# Patient Record
Sex: Female | Born: 1960
Health system: Southern US, Community
[De-identification: ages and names within clinical notes are randomized; demographics above are authoritative.]

## PROBLEM LIST (undated history)

## (undated) DIAGNOSIS — D649 Anemia, unspecified: Secondary | ICD-10-CM

## (undated) DIAGNOSIS — E7800 Pure hypercholesterolemia, unspecified: Secondary | ICD-10-CM

## (undated) DIAGNOSIS — N809 Endometriosis, unspecified: Secondary | ICD-10-CM

## (undated) DIAGNOSIS — K5792 Diverticulitis of intestine, part unspecified, without perforation or abscess without bleeding: Secondary | ICD-10-CM

## (undated) DIAGNOSIS — Z8041 Family history of malignant neoplasm of ovary: Secondary | ICD-10-CM

## (undated) DIAGNOSIS — R87619 Unspecified abnormal cytological findings in specimens from cervix uteri: Secondary | ICD-10-CM

## (undated) DIAGNOSIS — L659 Nonscarring hair loss, unspecified: Secondary | ICD-10-CM

## (undated) DIAGNOSIS — R7303 Prediabetes: Secondary | ICD-10-CM

## (undated) DIAGNOSIS — Z8051 Family history of malignant neoplasm of kidney: Secondary | ICD-10-CM

## (undated) DIAGNOSIS — Z8049 Family history of malignant neoplasm of other genital organs: Secondary | ICD-10-CM

## (undated) DIAGNOSIS — F419 Anxiety disorder, unspecified: Secondary | ICD-10-CM

## (undated) DIAGNOSIS — N200 Calculus of kidney: Secondary | ICD-10-CM

## (undated) DIAGNOSIS — Z8042 Family history of malignant neoplasm of prostate: Secondary | ICD-10-CM

## (undated) DIAGNOSIS — G43909 Migraine, unspecified, not intractable, without status migrainosus: Secondary | ICD-10-CM

## (undated) DIAGNOSIS — C55 Malignant neoplasm of uterus, part unspecified: Secondary | ICD-10-CM

## (undated) DIAGNOSIS — T7840XA Allergy, unspecified, initial encounter: Secondary | ICD-10-CM

## (undated) HISTORY — DX: Anemia, unspecified: D64.9

## (undated) HISTORY — PX: TONSILLECTOMY AND ADENOIDECTOMY: SHX28

## (undated) HISTORY — DX: Family history of malignant neoplasm of ovary: Z80.41

## (undated) HISTORY — DX: Family history of malignant neoplasm of kidney: Z80.51

## (undated) HISTORY — DX: Family history of malignant neoplasm of other genital organs: Z80.49

## (undated) HISTORY — DX: Nonscarring hair loss, unspecified: L65.9

## (undated) HISTORY — DX: Unspecified abnormal cytological findings in specimens from cervix uteri: R87.619

## (undated) HISTORY — PX: OTHER SURGICAL HISTORY: SHX169

## (undated) HISTORY — DX: Calculus of kidney: N20.0

## (undated) HISTORY — DX: Malignant neoplasm of uterus, part unspecified: C55

## (undated) HISTORY — PX: EYE SURGERY: SHX253

## (undated) HISTORY — PX: ABDOMINAL HYSTERECTOMY: SHX81

## (undated) HISTORY — DX: Family history of malignant neoplasm of prostate: Z80.42

## (undated) HISTORY — DX: Prediabetes: R73.03

## (undated) HISTORY — DX: Allergy, unspecified, initial encounter: T78.40XA

---

## 2013-12-26 ENCOUNTER — Emergency Department (HOSPITAL_COMMUNITY)
Admission: EM | Admit: 2013-12-26 | Discharge: 2013-12-26 | Disposition: A | Discharge status: Home / Self Care | Payer: BC Managed Care – PPO | Attending: Emergency Medicine | Admitting: Emergency Medicine

## 2013-12-26 ENCOUNTER — Encounter (HOSPITAL_COMMUNITY): Payer: Self-pay | Admitting: Emergency Medicine

## 2013-12-26 ENCOUNTER — Emergency Department (HOSPITAL_COMMUNITY): Payer: BC Managed Care – PPO

## 2013-12-26 DIAGNOSIS — R5381 Other malaise: Secondary | ICD-10-CM | POA: Insufficient documentation

## 2013-12-26 DIAGNOSIS — K5730 Diverticulosis of large intestine without perforation or abscess without bleeding: Secondary | ICD-10-CM | POA: Insufficient documentation

## 2013-12-26 DIAGNOSIS — Z9089 Acquired absence of other organs: Secondary | ICD-10-CM | POA: Insufficient documentation

## 2013-12-26 DIAGNOSIS — K5790 Diverticulosis of intestine, part unspecified, without perforation or abscess without bleeding: Secondary | ICD-10-CM

## 2013-12-26 DIAGNOSIS — R110 Nausea: Secondary | ICD-10-CM | POA: Insufficient documentation

## 2013-12-26 DIAGNOSIS — R231 Pallor: Secondary | ICD-10-CM | POA: Insufficient documentation

## 2013-12-26 DIAGNOSIS — E7800 Pure hypercholesterolemia, unspecified: Secondary | ICD-10-CM | POA: Insufficient documentation

## 2013-12-26 DIAGNOSIS — Z8742 Personal history of other diseases of the female genital tract: Secondary | ICD-10-CM | POA: Insufficient documentation

## 2013-12-26 DIAGNOSIS — Z8679 Personal history of other diseases of the circulatory system: Secondary | ICD-10-CM | POA: Insufficient documentation

## 2013-12-26 DIAGNOSIS — R55 Syncope and collapse: Secondary | ICD-10-CM

## 2013-12-26 DIAGNOSIS — Z791 Long term (current) use of non-steroidal anti-inflammatories (NSAID): Secondary | ICD-10-CM | POA: Insufficient documentation

## 2013-12-26 DIAGNOSIS — Z79899 Other long term (current) drug therapy: Secondary | ICD-10-CM | POA: Insufficient documentation

## 2013-12-26 DIAGNOSIS — R5383 Other fatigue: Secondary | ICD-10-CM

## 2013-12-26 DIAGNOSIS — F411 Generalized anxiety disorder: Secondary | ICD-10-CM | POA: Insufficient documentation

## 2013-12-26 HISTORY — DX: Endometriosis, unspecified: N80.9

## 2013-12-26 HISTORY — DX: Pure hypercholesterolemia, unspecified: E78.00

## 2013-12-26 HISTORY — DX: Diverticulitis of intestine, part unspecified, without perforation or abscess without bleeding: K57.92

## 2013-12-26 HISTORY — DX: Anxiety disorder, unspecified: F41.9

## 2013-12-26 HISTORY — DX: Migraine, unspecified, not intractable, without status migrainosus: G43.909

## 2013-12-26 LAB — COMPREHENSIVE METABOLIC PANEL
ALT: 25 U/L (ref 0–35)
AST: 25 U/L (ref 0–37)
Albumin: 4 g/dL (ref 3.5–5.2)
Alkaline Phosphatase: 92 U/L (ref 39–117)
BUN: 15 mg/dL (ref 6–23)
CO2: 25 mEq/L (ref 19–32)
Calcium: 9.5 mg/dL (ref 8.4–10.5)
Chloride: 104 mEq/L (ref 96–112)
Creatinine, Ser: 0.69 mg/dL (ref 0.50–1.10)
GFR calc Af Amer: >90 mL/min (ref >90)
GFR calc non Af Amer: >90 mL/min (ref >90)
Glucose, Bld: 91 mg/dL (ref 70–99)
Potassium: 3.9 mEq/L (ref 3.7–5.3)
Sodium: 142 mEq/L (ref 137–147)
Total Bilirubin: 0.7 mg/dL (ref 0.3–1.2)
Total Protein: 7.5 g/dL (ref 6.0–8.3)

## 2013-12-26 LAB — URINALYSIS, ROUTINE W REFLEX MICROSCOPIC
Bilirubin Urine: NEGATIVE
Glucose, UA: NEGATIVE mg/dL
Hgb urine dipstick: NEGATIVE
Ketones, ur: NEGATIVE mg/dL
Leukocytes, UA: NEGATIVE
Nitrite: NEGATIVE
Protein, ur: NEGATIVE mg/dL
Specific Gravity, Urine: 1.008 (ref 1.005–1.030)
Urobilinogen, UA: 0.2 mg/dL (ref 0.0–1.0)
pH: 7 (ref 5.0–8.0)

## 2013-12-26 LAB — CBC WITH DIFFERENTIAL/PLATELET
Basophils Absolute: 0 10*3/uL (ref 0.0–0.1)
Basophils Relative: 0 % (ref 0–1)
Eosinophils Absolute: 1.4 10*3/uL — ABNORMAL HIGH (ref 0.0–0.7)
Eosinophils Relative: 16 % — ABNORMAL HIGH (ref 0–5)
HCT: 39.7 % (ref 36.0–46.0)
Hemoglobin: 13.2 g/dL (ref 12.0–15.0)
Lymphocytes Relative: 28 % (ref 12–46)
Lymphs Abs: 2.5 10*3/uL (ref 0.7–4.0)
MCH: 30 pg (ref 26.0–34.0)
MCHC: 33.2 g/dL (ref 30.0–36.0)
MCV: 90.2 fL (ref 78.0–100.0)
Monocytes Absolute: 0.5 10*3/uL (ref 0.1–1.0)
Monocytes Relative: 6 % (ref 3–12)
Neutro Abs: 4.3 10*3/uL (ref 1.7–7.7)
Neutrophils Relative %: 50 % (ref 43–77)
Platelets: 202 10*3/uL (ref 150–400)
RBC: 4.4 MIL/uL (ref 3.87–5.11)
RDW: 13 % (ref 11.5–15.5)
WBC: 8.6 10*3/uL (ref 4.0–10.5)

## 2013-12-26 LAB — TYPE AND SCREEN
ABO/RH(D): A POS
Antibody Screen: NEGATIVE

## 2013-12-26 LAB — POC OCCULT BLOOD, ED: Fecal Occult Bld: POSITIVE — AB

## 2013-12-26 LAB — ABO/RH: ABO/RH(D): A POS

## 2013-12-26 LAB — CBG MONITORING, ED: Glucose-Capillary: 76 mg/dL (ref 70–99)

## 2013-12-26 LAB — I-STAT CG4 LACTIC ACID, ED: Lactic Acid, Venous: 0.87 mmol/L (ref 0.5–2.2)

## 2013-12-26 LAB — TROPONIN I: Troponin I: <0.3 ng/mL (ref <0.30)

## 2013-12-26 NOTE — ED Notes (Signed)
Pt reports rectal bleeding, weakness, and family reports lethargy. Pt reports she has been having rectal bleeding x1.5 years, went to pcp 1 month ago, has colonoscopy scheduled. For last 2 weeks rectal bleeding has increased, bright red blood, black and tarry at times, also has diarrhea. Abdominal pain 5/10. Family came to see pt on Sunday and noticed increased weakness and lethargy, which has progressed. Daughter called pcp and was encouraged to bring pt to ED.  Nausea present. Denies dysuria.

## 2013-12-26 NOTE — ED Provider Notes (Signed)
CSN: [id number]     Arrival date & time 12/26/13  1404 History   First MD Initiated Contact with Patient 12/26/13 1459     Chief Complaint  Patient presents with  . Rectal Bleeding     (Consider location/radiation/quality/duration/timing/severity/associated sxs/prior Treatment) HPI  This a 53 year old female with history of cholesterol, diverticulitis, endometriosis who presents with rectal bleeding and weakness. Per the patient she has had multiple episodes of red blood per rectum over the last year and a half. She was referred to a GI specialist and scheduled for a colonoscopy. She reports over last 2 weeks rectal bleeding has increased including part of blood as well as dark per stools. Patient also reports diarrhea. She reports diffuse abdominal pain. Currently rates a 5/10. She denies any vomiting. Family has noticed increased weakness and lethargy. Patient endorses nausea.  Was called immediately to the patient's bedside as she appears to have had a syncopal episode. Patient came to she is awake, alert, and oriented. She was then able to provide history. She did appear pale. She had palpable radial pulses.  Past Medical History  Diagnosis Date  . High cholesterol   . Migraines   . Diverticulitis   . Endometriosis   . Anxiety    Past Surgical History  Procedure Laterality Date  . Abdominal hysterectomy     History reviewed. No pertinent family history. History  Substance Use Topics  . Smoking status: Never Smoker   . Smokeless tobacco: Not on file  . Alcohol Use: Yes     Comment: rarely   OB History   Grav Para Term Preterm Abortions TAB SAB Ect Mult Living                 Review of Systems  Constitutional: Positive for fatigue. Negative for fever.  Respiratory: Negative for cough, chest tightness and shortness of breath.   Cardiovascular: Negative for chest pain.  Gastrointestinal: Positive for nausea, abdominal pain, diarrhea and blood in stool. Negative for  vomiting.  Genitourinary: Negative for dysuria.  Musculoskeletal: Negative for back pain.  Skin: Positive for pallor.  Neurological: Positive for syncope. Negative for headaches.  Psychiatric/Behavioral: Negative for confusion.  All other systems reviewed and are negative.     Allergies  Ivp dye  Home Medications   Prior to Admission medications   Medication Sig Start Date End Date Taking? Authorizing Provider  ALPRAZolam Duanne Moron) 1 MG tablet Take 0.5 mg by mouth at bedtime as needed for anxiety.   Yes Historical Provider, MD  atorvastatin (LIPITOR) 40 MG tablet Take 40 mg by mouth at bedtime.   Yes Historical Provider, MD  citalopram (CELEXA) 40 MG tablet Take 40 mg by mouth at bedtime.   Yes Historical Provider, MD  Cyanocobalamin (VITAMIN B-12 PO) Take 1 tablet by mouth daily.   Yes Historical Provider, MD  lactose free nutrition (BOOST) LIQD Take 237 mLs by mouth 3 (three) times daily between meals.   Yes Historical Provider, MD  MELATONIN PO Take 1 tablet by mouth at bedtime.   Yes Historical Provider, MD  naproxen sodium (ANAPROX) 220 MG tablet Take 220 mg by mouth 2 (two) times daily with a meal.   Yes Historical Provider, MD  OVER THE COUNTER MEDICATION Take 2 oz by mouth daily. Vemma Liq   Yes Historical Provider, MD  PEDIALYTE (PEDIALYTE) SOLN Take 240 mLs by mouth every 8 (eight) hours.   Yes Historical Provider, MD  Polyethyl Glycol-Propyl Glycol (SYSTANE) 0.4-0.3 % SOLN Place 1 drop  into both eyes 2 (two) times daily.   Yes Historical Provider, MD   BP 130/78  Pulse 77  Temp(Src) 98.1 F (36.7 C) (Oral)  Resp 21  SpO2 99% Physical Exam  Nursing note and vitals reviewed. Constitutional: She is oriented to person, place, and time. No distress.  Pale  HENT:  Head: Normocephalic and atraumatic.  Mouth/Throat: Oropharynx is clear and moist.  Eyes: Conjunctivae are normal. Pupils are equal, round, and reactive to light.  Conjunctiva without evidence of pallor  Neck:  Neck supple.  Cardiovascular: Normal rate, regular rhythm and normal heart sounds.   No murmur heard. Pulmonary/Chest: Effort normal and breath sounds normal. No respiratory distress. She has no wheezes.  Abdominal: Soft. Bowel sounds are normal. She exhibits no distension. There is tenderness. There is no rebound and no guarding.  Genitourinary: Guaiac negative stool.  Musculoskeletal: She exhibits no edema.  Neurological: She is alert and oriented to person, place, and time.  Skin: Skin is warm and dry.  Psychiatric: She has a normal mood and affect.    ED Course  Procedures (including critical care time) Labs Review Labs Reviewed  CBC WITH DIFFERENTIAL - Abnormal; Notable for the following:    Eosinophils Relative 16 (*)    Eosinophils Absolute 1.4 (*)    All other components within normal limits  POC OCCULT BLOOD, ED - Abnormal; Notable for the following:    Fecal Occult Bld POSITIVE (*)    All other components within normal limits  COMPREHENSIVE METABOLIC PANEL  URINALYSIS, ROUTINE W REFLEX MICROSCOPIC  TROPONIN I  CBG MONITORING, ED  I-STAT CG4 LACTIC ACID, ED  TYPE AND SCREEN  ABO/RH    Imaging Review Ct Abdomen Pelvis Wo Contrast  12/26/2013   CLINICAL DATA:  Rectal bleeding  EXAM: CT ABDOMEN AND PELVIS WITHOUT CONTRAST  TECHNIQUE: Multidetector CT imaging of the abdomen and pelvis was performed following the standard protocol without IV contrast.  COMPARISON:  None.  FINDINGS: BODY WALL: Unremarkable.  LOWER CHEST: Unremarkable.  ABDOMEN/PELVIS:  Liver: No focal abnormality.  Biliary: No evidence of biliary obstruction or stone.  Pancreas: Unremarkable.  Spleen: Unremarkable.  Adrenals: Unremarkable.  Kidneys and ureters: No hydronephrosis or stone.  Bladder: Unremarkable.  Reproductive: Hysterectomy.  Normal-appearing ovaries.  Bowel: No bowel obstruction. No bowel wall thickened. There is mild distal colonic diverticulosis. Negative appendix.  Retroperitoneum: Mild  haziness of the jejunal mesenteric fat, usually incidental in the absence of adenopathy, mass, or bowel wall thickening.  Peritoneum: No pneumoperitoneum or ascites.  Vascular: No acute abnormality.  OSSEOUS: No acute abnormalities.  IMPRESSION: Distal colonic diverticulosis. No other identifiable cause of rectal bleeding.   Electronically Signed   By: Jorje Guild M.D.   On: 12/26/2013 21:26     EKG Interpretation   Date/Time:  Tuesday December 26 2013 14:50:35 EDT Ventricular Rate:  65 PR Interval:  140 QRS Duration: 92 QT Interval:  437 QTC Calculation: 454 R Axis:   21 Text Interpretation:  Sinus rhythm Confirmed by HORTON  MD, Loma Sousa  ([id number]) on 12/26/2013 3:37:03 PM      MDM   Final diagnoses:  Diverticulosis  Syncope   Patient presents with rectal bleeding.  She reports over your symptoms with acute worsening over the last several days. Was called to the bedside for what appeared to be a syncopal episode. The patient was awake, alert, and oriented. She is nontoxic. Vital signs are within normal limits. Orthostatics negative. 2 large-bore IVs were placed and patient was  typed and screened.  Workup is largely unremarkable including a hemoglobin of 13.2 which is within normal range. Patient was fecal occult positive but no gross blood noted. Unsure of whether she had a true syncopal event prior to my evaluation.  I discussed with patient her workup and that I feel she can follow up with GI as planned. There is no need for more emergent endoscopy tonight. Patient is very concerned about her symptoms and "wants answers." Her abdomen continues to be benign. I discussed the patient that this could be related to her diverticular disease that she is known to have. Patient is requesting CT scanning. I discussed with patient that I felt this was likely low field but given persistence of symptoms and possible syncopal episode prior to my evaluation, will obtain CT. CT shows diverticulosis without  diverticulitis. Provided results patients. She was hemodynamically stable throughout her ER stay. She will followup with her GI doctor as planned. Patient was given strict return precautions.  After history, exam, and medical workup I feel the patient has been appropriately medically screened and is safe for discharge home. Pertinent diagnoses were discussed with the patient. Patient was given return precautions.     Merryl Hacker, MD 12/28/13 [phone]

## 2013-12-26 NOTE — ED Notes (Signed)
Pt ambulatory to bathroom with steady gait.

## 2013-12-26 NOTE — ED Notes (Signed)
Pt brady to 81s. Thready radial and femoral pulses present. Horton MD at bedside. POC CBG 76. MD aware. Fluids started.

## 2013-12-26 NOTE — Discharge Instructions (Signed)
Diverticulosis  Diverticulosis is a common condition that develops when small pouches (diverticula) form in the wall of the colon. The risk of diverticulosis increases with age. It happens more often in people who eat a low-fiber diet. Most individuals with diverticulosis have no symptoms. Those individuals with symptoms usually experience abdominal pain, constipation, or loose stools (diarrhea).  HOME CARE INSTRUCTIONS   · Increase the amount of fiber in your diet as directed by your caregiver or dietician. This may reduce symptoms of diverticulosis.  · Your caregiver may recommend taking a dietary fiber supplement.  · Drink at least 6 to 8 glasses of water each day to prevent constipation.  · Try not to strain when you have a bowel movement.  · Your caregiver may recommend avoiding nuts and seeds to prevent complications, although this is still an uncertain benefit.  · Only take over-the-counter or prescription medicines for pain, discomfort, or fever as directed by your caregiver.  FOODS WITH HIGH FIBER CONTENT INCLUDE:  · Fruits. Apple, peach, pear, tangerine, raisins, prunes.  · Vegetables. Brussels sprouts, asparagus, broccoli, cabbage, carrot, cauliflower, romaine lettuce, spinach, summer squash, tomato, winter squash, zucchini.  · Starchy Vegetables. Baked beans, kidney beans, lima beans, split peas, lentils, potatoes (with skin).  · Grains. Whole wheat bread, brown rice, bran flake cereal, plain oatmeal, white rice, shredded wheat, bran muffins.  SEEK IMMEDIATE MEDICAL CARE IF:   · You develop increasing pain or severe bloating.  · You have an oral temperature above 102° F (38.9° C), not controlled by medicine.  · You develop vomiting or bowel movements that are bloody or black.  Document Released: 04/09/2004 Document Revised: 10/05/2011 Document Reviewed: 12/11/2009  Exit[employer]® Patient Information ©2014 [employer].

## 2014-01-18 ENCOUNTER — Telehealth: Payer: Self-pay | Admitting: Obstetrics and Gynecology

## 2014-01-18 NOTE — Telephone Encounter (Signed)
Confirming pts appt °

## 2014-01-18 NOTE — Telephone Encounter (Signed)
Confirmed.

## 2014-01-25 ENCOUNTER — Ambulatory Visit (INDEPENDENT_AMBULATORY_CARE_PROVIDER_SITE_OTHER): Payer: BC Managed Care – PPO | Admitting: Obstetrics and Gynecology

## 2014-01-25 ENCOUNTER — Encounter: Payer: Self-pay | Admitting: Obstetrics and Gynecology

## 2014-01-25 VITALS — BP 110/76 | HR 84 | Resp 14 | Ht 61.75 in | Wt 176.4 lb

## 2014-01-25 DIAGNOSIS — Z0000 Encounter for general adult medical examination without abnormal findings: Secondary | ICD-10-CM

## 2014-01-25 DIAGNOSIS — Z01419 Encounter for gynecological examination (general) (routine) without abnormal findings: Secondary | ICD-10-CM

## 2014-01-25 NOTE — Patient Instructions (Signed)

## 2014-01-25 NOTE — Progress Notes (Signed)
Patient ID: Maria Gross, female   DOB: 08/30/1960, 53 y.o.   MRN: [id number] GYNECOLOGY VISIT  PCP:  Gavin Pound, MD  Referring provider:   HPI: 53 y.o.   Married  Caucasian  female   G1P1001 with No LMP recorded. Patient has had a hysterectomy.   here for   AEX. Status post laparoscopic hysterectomy.for cervical dysplasia   Has blood in the stool and diarrhea.  Just had colonoscopy - dx with diverticulitis and internal hemorrhoids.   History of nephrolithiasis and calcium deposits in ureters?  Age 61, 52, and early 77s.  Hgb:  PCP Urine:  Neg  GYNECOLOGIC HISTORY: No LMP recorded. Patient has had a hysterectomy. Sexually active:  yes Partner preference: female Contraception:   Hysterectomy Menopausal hormone therapy: no DES exposure:  no  Blood transfusions:   no Sexually transmitted diseases:   no GYN procedures and prior surgeries:  L-TAH-ovaries remain Last mammogram: 08/2009 X[facility],  [place]                Last pap and high risk HPV testing:  08/2009 wnl--[facility],  [place]  History of abnormal pap smear: yes, when 36 yrs. Old pt. Had pre-cancerous cells on pap which lead to Hysterectomy.    OB History   Grav Para Term Preterm Abortions TAB SAB Ect Mult Living   1 1 1       1        LIFESTYLE: Exercise:    Stationary bike and walking           Tobacco:   no Alcohol:     Occ. wine Drug use:  no  OTHER HEALTH MAINTENANCE: Tetanus/TDap:   9 years ago Gardisil:              n/a Influenza:           04/2013 Zostavax:           n/a  Bone density:    2010 normal: [facility], [place] (free through work) Colonoscopy:   01-09-14 normal with Dr. Anson Fret except for diverticulitis and internal hemorrhoids.  Next colonoscopy due 12/2023.  Cholesterol check:  02/2013 normal  Family History  Problem Relation Age of Onset  . Diabetes Mother 63  . Other Mother     passed away from a blood clot after sinus surgery  . Migraines Mother   . COPD Father 75  . Diabetes Father    . Cancer Maternal Grandfather 58    dec metastatic prostate ca  . Diabetes Sister     There are no active problems to display for this patient.  Past Medical History  Diagnosis Date  . High cholesterol   . Migraines   . Diverticulitis   . Endometriosis   . Anxiety   . Abnormal Pap smear of cervix     --age 44 had precancerous changes on pap and had hysterectomy  . Anemia     Past Surgical History  Procedure Laterality Date  . Tonsillectomy and adenoidectomy      -age 5  . Abdominal hysterectomy      L-TAH--ovaries remain  . Left kidney blockage Left     age 11 and age 55  . Urethral stent Left     age 53    ALLERGIES: Ivp dye  Current Outpatient Prescriptions  Medication Sig Dispense Refill  . ALPRAZolam (XANAX) 1 MG tablet Take 0.5 mg by mouth at bedtime as needed for anxiety.      Marland Kitchen  atorvastatin (LIPITOR) 40 MG tablet Take 40 mg by mouth at bedtime.      . citalopram (CELEXA) 40 MG tablet Take 40 mg by mouth at bedtime.      . Cyanocobalamin (VITAMIN B-12 PO) Take 1 tablet by mouth daily.      Marland Kitchen MELATONIN PO Take 1 tablet by mouth at bedtime.      . naproxen sodium (ANAPROX) 220 MG tablet Take 220 mg by mouth 2 (two) times daily with a meal.      . OVER THE COUNTER MEDICATION Take 2 oz by mouth daily. Vemma Liq      . Polyethyl Glycol-Propyl Glycol (SYSTANE) 0.4-0.3 % SOLN Place 1 drop into both eyes 2 (two) times daily.       No current facility-administered medications for this visit.     ROS:  Pertinent items are noted in HPI.  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  PHYSICAL EXAMINATION:    BP 110/76  Pulse 84  Resp 14  Ht 5' 1.75" (1.568 m)  Wt 176 lb 6.4 oz (80.015 kg)  BMI 32.54 kg/m2   Wt Readings from Last 3 Encounters:  01/25/14 176 lb 6.4 oz (80.015 kg)     Ht Readings from Last 3 Encounters:  01/25/14 5' 1.75" (1.568 m)    General appearance: alert, cooperative and appears stated age Head: Normocephalic,  without obvious abnormality, atraumatic Neck: no adenopathy, supple, symmetrical, trachea midline and thyroid not enlarged, symmetric, no tenderness/mass/nodules Lungs: clear to auscultation bilaterally Breasts: Inspection negative, No nipple retraction or dimpling, No nipple discharge or bleeding, No axillary or supraclavicular adenopathy, Normal to palpation without dominant masses Heart: regular rate and rhythm Abdomen: soft, non-tender; no masses,  no organomegaly Extremities: extremities normal, atraumatic, no cyanosis or edema Skin: Skin color, texture, turgor normal. No rashes or lesions Lymph nodes: Cervical, supraclavicular, and axillary nodes normal. No abnormal inguinal nodes palpated Neurologic: Grossly normal  Pelvic: External genitalia:  no lesions              Urethra:  normal appearing urethra with no masses, tenderness or lesions              Bartholins and Skenes: normal                 Vagina: normal appearing vagina with normal color and discharge, no lesions              Cervix:  absent              Pap and high risk HPV testing done: Yes.  .            Bimanual Exam:  Uterus:   absent                                      Adnexa: normal adnexa in size, nontender and no masses                                      Rectovaginal: Confirms                                      Anus:  normal sphincter tone, no lesions  ASSESSMENT  Normal gynecologic exam. Status  post laparoscopic hysterectomy for cervical dysplasia.   PLAN  Mammogram recommended yearly.  Pap smear and high risk HPV testing done.  Counseled on self breast exam, Calcium and vitamin D intake, exercise. Return annually or prn   An After Visit Summary was printed and given to the patient.

## 2014-01-30 LAB — IPS PAP TEST WITH HPV

## 2014-02-20 ENCOUNTER — Ambulatory Visit
Admission: RE | Admit: 2014-02-20 | Discharge: 2014-02-20 | Disposition: A | Discharge status: Home / Self Care | Payer: BC Managed Care – PPO | Source: Ambulatory Visit | Attending: Obstetrics and Gynecology | Admitting: Obstetrics and Gynecology

## 2014-02-20 ENCOUNTER — Other Ambulatory Visit: Payer: Self-pay | Admitting: Obstetrics and Gynecology

## 2014-02-20 ENCOUNTER — Encounter (INDEPENDENT_AMBULATORY_CARE_PROVIDER_SITE_OTHER): Payer: Self-pay

## 2014-02-20 DIAGNOSIS — Z1231 Encounter for screening mammogram for malignant neoplasm of breast: Secondary | ICD-10-CM

## 2014-02-20 DIAGNOSIS — Z01419 Encounter for gynecological examination (general) (routine) without abnormal findings: Secondary | ICD-10-CM

## 2014-04-09 ENCOUNTER — Encounter: Payer: Self-pay | Admitting: Obstetrics and Gynecology

## 2014-05-28 ENCOUNTER — Encounter: Payer: Self-pay | Admitting: Obstetrics and Gynecology

## 2014-09-20 ENCOUNTER — Telehealth: Payer: Self-pay | Admitting: Obstetrics and Gynecology

## 2014-09-20 NOTE — Telephone Encounter (Signed)
Patient states she would like to see Dr. Quincy Simmonds to discuss menopausal symptoms. Patient declines earlier appointments offered, scheduled for 10/05/14 at 0945. She will call back with any concerns or worsening symptoms.   Routing to provider for final review. Patient agreeable to disposition. Will close encounter

## 2014-09-20 NOTE — Telephone Encounter (Signed)
Pt states she is having hot flashes, irritability and anxiety. Believes she is starting menopause. Requests appointment with Dr Quincy Simmonds.  Work number is best before 5pm. It is a direct line.

## 2014-10-05 ENCOUNTER — Encounter: Payer: Self-pay | Admitting: Obstetrics and Gynecology

## 2014-10-05 ENCOUNTER — Ambulatory Visit (INDEPENDENT_AMBULATORY_CARE_PROVIDER_SITE_OTHER): Payer: BLUE CROSS/BLUE SHIELD | Admitting: Obstetrics and Gynecology

## 2014-10-05 VITALS — BP 110/70 | HR 90 | Ht 61.75 in | Wt 182.4 lb

## 2014-10-05 DIAGNOSIS — N951 Menopausal and female climacteric states: Secondary | ICD-10-CM | POA: Diagnosis not present

## 2014-10-05 LAB — TSH: TSH: 0.765 u[IU]/mL (ref 0.350–4.500)

## 2014-10-05 MED ORDER — ESTRADIOL 0.05 MG/24HR TD PTTW: 1.0000 | MEDICATED_PATCH | TRANSDERMAL | Status: DC

## 2014-10-05 NOTE — Patient Instructions (Signed)
Estradiol skin patches What is this medicine? ESTRADIOL (es tra DYE ole) skin patches contain an estrogen. It is mostly used as hormone replacement in menopausal women. It helps to treat hot flashes and prevent osteoporosis. It is also used to treat women with low estrogen levels or those who have had their ovaries removed. This medicine may be used for other purposes; ask your health care provider or pharmacist if you have questions. COMMON BRAND NAME(S): Alora, Climara, Esclim, Estraderm, FemPatch, Menostar, Minivelle, Vivelle, Vivelle-Dot What should I tell my health care provider before I take this medicine? They need to know if you have any of these conditions: -abnormal vaginal bleeding -blood vessel disease or blood clots -breast, cervical, endometrial, ovarian, liver, or uterine cancer -dementia -diabetes -gallbladder disease -heart disease or recent heart attack -high blood pressure -high cholesterol -high level of calcium in the blood -hysterectomy -kidney disease -liver disease -migraine headaches -protein C deficiency -protein S deficiency -stroke -systemic lupus erythematosus (SLE) -tobacco smoker -an unusual or allergic reaction to estrogens, other hormones, medicines, foods, dyes, or preservatives -pregnant or trying to get pregnant -breast-feeding How should I use this medicine? This medicine is for external use only. Follow the directions on the prescription label. Tear open the pouch, do not use scissors. Remove the stiff protective liner covering the adhesive. Try not to touch the adhesive. Apply the patch, sticky side to the skin, to an area that is clean, dry and hairless. Avoid injured, irritated, calloused, or scarred areas. Do not apply the skin patches to your breasts or around the waistline. Use a different site each time to prevent skin irritation. Do not cut or trim the patch. Do not stop using except on the advice of your doctor or health care professional.  Do not wear more than one patch at a time unless you are told to do so by your doctor or health care professional. Contact your pediatrician regarding the use of this medicine in children. Special care may be needed. A patient package insert for the product will be given with each prescription and refill. Read this sheet carefully each time. The sheet may change frequently. Overdosage: If you think you have taken too much of this medicine contact a poison control center or emergency room at once. NOTE: This medicine is only for you. Do not share this medicine with others. What if I miss a dose? If you miss a dose, apply it as soon as you can. If it is almost time for your next dose, apply only that dose. Do not apply double or extra doses. What may interact with this medicine? Do not take this medicine with any of the following medications: -aromatase inhibitors like aminoglutethimide, anastrozole, exemestane, letrozole, testolactone This medicine may also interact with the following medications: -carbamazepine -certain antibiotics used to treat infections -certain barbiturates used for inducing sleep or treating seizures -grapefruit juice -medicines for fungus infections like itraconazole and ketoconazole -raloxifene or tamoxifen -rifabutin, rifampin, or rifapentine -ritonavir -St. John's Wort This list may not describe all possible interactions. Give your health care provider a list of all the medicines, herbs, non-prescription drugs, or dietary supplements you use. Also tell them if you smoke, drink alcohol, or use illegal drugs. Some items may interact with your medicine. What should I watch for while using this medicine? Visit your doctor or health care professional for regular checks on your progress. You will need a regular breast and pelvic exam and Pap smear while on this medicine. You should also   discuss the need for regular mammograms with your health care professional, and follow  his or her guidelines for these tests. This medicine can make your body retain fluid, making your fingers, hands, or ankles swell. Your blood pressure can go up. Contact your doctor or health care professional if you feel you are retaining fluid. If you have any reason to think you are pregnant, stop taking this medicine right away and contact your doctor or health care professional. Smoking increases the risk of getting a blood clot or having a stroke while you are taking this medicine, especially if you are more than 53 years old. You are strongly advised not to smoke. If you wear contact lenses and notice visual changes, or if the lenses begin to feel uncomfortable, consult your eye doctor or health care professional. This medicine can increase the risk of developing a condition (endometrial hyperplasia) that may lead to cancer of the lining of the uterus. Taking progestins, another hormone drug, with this medicine lowers the risk of developing this condition. Therefore, if your uterus has not been removed (by a hysterectomy), your doctor may prescribe a progestin for you to take together with your estrogen. You should know, however, that taking estrogens with progestins may have additional health risks. You should discuss the use of estrogens and progestins with your health care professional to determine the benefits and risks for you. If you are going to have surgery or an MRI, you may need to stop taking this medicine. Consult your health care professional for advice before you schedule the surgery. You may bathe or participate in other activities while wearing your patch. If the patch pulls loose or falls off, you may reapply it if the patch is sticky enough to stay on the skin. You should reapply the patch in a different area. Use a fresh patch if it will no longer stick. What side effects may I notice from receiving this medicine? Side effects that you should report to your doctor or health care  professional as soon as possible: -allergic reactions like skin rash, itching or hives, swelling of the face, lips, or tongue -breast tissue changes or discharge -changes in vision -chest pain -confusion, trouble speaking or understanding -dark urine -general ill feeling or flu-like symptoms -light-colored stools -nausea, vomiting -pain, swelling, warmth in the leg -right upper belly pain -severe headaches -shortness of breath -sudden numbness or weakness of the face, arm or leg -trouble walking, dizziness, loss of balance or coordination -unusual vaginal bleeding -yellowing of the eyes or skin Side effects that usually do not require medical attention (report to your doctor or health care professional if they continue or are bothersome): -hair loss -increased hunger or thirst -increased urination -symptoms of vaginal infection like itching, irritation or unusual discharge -unusually weak or tired This list may not describe all possible side effects. Call your doctor for medical advice about side effects. You may report side effects to FDA at 1-800-FDA-1088. Where should I keep my medicine? Keep out of the reach of children. Store at room temperature below 30 degrees C (86 degrees F). Do not store any patches that have been removed from their protective pouch. Throw away any unused medicine after the expiration date. Dispose of used patches properly. Since used patches may still contain active hormones, fold the patch in half so that it sticks to itself prior to disposal. NOTE: This sheet is a summary. It may not cover all possible information. If you have questions about this medicine,  talk to your doctor, pharmacist, or health care provider.  2015, Elsevier/[employer] Standard. (2010-10-15 09:19:41)

## 2014-10-05 NOTE — Progress Notes (Signed)
Patient ID: Maria Gross, female   DOB: 1961-06-19, 54 y.o.   MRN: [id number] GYNECOLOGY VISIT  PCP:  Gavin Pound, MD  Referring provider:   HPI: 54 y.o.   Married  Caucasian  female   G1P1001 with No LMP recorded. Patient has had a hysterectomy.   here for consultation regarding menopausal symptoms.  Patient complains of hot flashes, "can't sleep", headaches and irritability.   Night sweats.   Tried relaxation therapy.  No over the counter remedies.   No contraindications to estrogen.   GYNECOLOGIC HISTORY: No LMP recorded. Patient has had a hysterectomy. Sexually active:  yes Partner preference: female Contraception:  hysterectomy  Menopausal hormone therapy: none DES exposure:  no  Blood transfusions: no   Sexually transmitted diseases:  no  GYN procedures and prior surgeries:  L-TAH-ovaries remain Last mammogram:  02-20-14 fibroglandular density/nl:[facility]               Last pap and high risk HPV testing:  01-25-14 wnl:neg HR HPV History of abnormal pap smear:  Yes, when 36 yrs.old pt. Had pre-cancerous cells on pap which lead to hysterectomy   OB History    Gravida Para Term Preterm AB TAB SAB Ectopic Multiple Living   1 1 1       1        LIFESTYLE: Exercise: stationary bike and walking              Tobacco:   no Alcohol:  occ wine Drug use:  no  There are no active problems to display for this patient.   Past Medical History  Diagnosis Date  . High cholesterol   . Migraines   . Diverticulitis   . Endometriosis   . Anxiety   . Abnormal Pap smear of cervix     --age 73 had precancerous changes on pap and had hysterectomy  . Anemia     Past Surgical History  Procedure Laterality Date  . Tonsillectomy and adenoidectomy      -age 9  . Abdominal hysterectomy      L-TAH--ovaries remain  . Left kidney blockage Left     age 32 and age 16  . Urethral stent Left     age 14    Current Outpatient Prescriptions  Medication Sig Dispense Refill  .  ALPRAZolam (XANAX) 1 MG tablet Take 0.5 mg by mouth at bedtime as needed for anxiety.    . citalopram (CELEXA) 40 MG tablet Take 40 mg by mouth at bedtime.    . Cyanocobalamin (VITAMIN B-12 PO) Take 1 tablet by mouth daily.    Marland Kitchen loratadine (CLARITIN) 10 MG tablet Take 10 mg by mouth daily.    Marland Kitchen MELATONIN PO Take 1 tablet by mouth at bedtime.    . Multiple Vitamin (MULTIVITAMIN) capsule Take 1 capsule by mouth daily.    . polycarbophil (FIBERCON) 625 MG tablet Take 625 mg by mouth daily.    Vladimir Faster Glycol-Propyl Glycol (SYSTANE) 0.4-0.3 % SOLN Place 1 drop into both eyes 2 (two) times daily.    Marland Kitchen atorvastatin (LIPITOR) 40 MG tablet Take 40 mg by mouth at bedtime.     No current facility-administered medications for this visit.     ALLERGIES: Ivp dye  Family History  Problem Relation Age of Onset  . Diabetes Mother 55  . Other Mother     passed away from a blood clot after sinus surgery  . Migraines Mother   . COPD Father 64  .  Diabetes Father   . Cancer Maternal Grandfather 5    dec metastatic prostate ca  . Diabetes Sister     History   Social History  . Marital Status: Married    Spouse Name: N/A  . Number of Children: N/A  . Years of Education: N/A   Occupational History  . Not on file.   Social History Main Topics  . Smoking status: Never Smoker   . Smokeless tobacco: Not on file  . Alcohol Use: 0.0 oz/week    0 Standard drinks or equivalent per week     Comment: rarely wine  . Drug Use: No  . Sexual Activity:    Partners: Male     Comment: Hyst   Other Topics Concern  . Not on file   Social History Narrative    ROS:  Pertinent items are noted in HPI.  PHYSICAL EXAMINATION:    BP 110/70 mmHg  Pulse 90  Ht 5' 1.75" (1.568 m)  Wt 182 lb 6.4 oz (82.736 kg)  BMI 33.65 kg/m2   Wt Readings from Last 3 Encounters:  10/05/14 182 lb 6.4 oz (82.736 kg)  01/25/14 176 lb 6.4 oz (80.015 kg)     Ht Readings from Last 3 Encounters:  10/05/14 5' 1.75"  (1.568 m)  01/25/14 5' 1.75" (1.568 m)    General appearance: alert, cooperative and appears stated age  ASSESSMENT  Menopausal symptoms.  Anxiety treated with Celexa.  PLAN  Discussion of menopausal symptoms and changes and treatment options - herbal remedies, estrogen therapy, and nonestrogenic prescription - SSRIs, Effexor.   Will proceed with Minivelle 0.05 mg twice weekly.  Discussed risks of DVT, PE, MI, stroke, breast cancer.  Instructed in use.  Handout on menopause to patient. Follow up in about 2 months.   An After Visit Summary was printed and given to the patient.  15 minutes face to face time of which over 50% was spent in counseling.

## 2014-10-06 LAB — FOLLICLE STIMULATING HORMONE: FSH: 105.6 m[IU]/mL

## 2014-10-06 LAB — ESTRADIOL: Estradiol: 27.4 pg/mL

## 2014-10-17 ENCOUNTER — Telehealth: Payer: Self-pay | Admitting: Obstetrics and Gynecology

## 2014-10-17 NOTE — Telephone Encounter (Signed)
Spoke with patient. Patient states that since she has started using the [place] patches her breasts have been sore, swollen, and she has nipple tenderness.  Symptoms started two days after starting [place]. States hot flashes, headaches, and irritability have greatly decreased. "It is working great for my other symptoms but not I have this new symptom. I paid 150 dollars for a three month supply so I prefer not to change to something else but maybe to change my patch less." Advised patient will need to speak with provider and return call with further recommendations. Patient is agreeable.

## 2014-10-17 NOTE — Telephone Encounter (Signed)
Pt having problems with the hormone patches.

## 2014-10-17 NOTE — Telephone Encounter (Signed)
Try cutting the patch in half to lower the dosage.  Use 1/2 patch twice a week on clean, dry skin.  Contact the office back to report how she is doing.

## 2014-10-17 NOTE — Telephone Encounter (Signed)
Left message to call Kaulana Brindle at [phone]. 

## 2014-10-18 NOTE — Telephone Encounter (Signed)
Spoke with patient. Advised of message as seen below from [place]. Patient is agreeable and verbalizes understanding. Will call to provide update on how she is doing with dosage change.  Routing to provider for final review. Patient agreeable to disposition. Will close encounter

## 2014-12-04 ENCOUNTER — Telehealth: Payer: Self-pay | Admitting: Obstetrics and Gynecology

## 2014-12-04 NOTE — Telephone Encounter (Signed)
Provider cx. Left message to cb and rs. Pt appt for 2 month recheck

## 2014-12-05 ENCOUNTER — Ambulatory Visit: Payer: BLUE CROSS/BLUE SHIELD | Admitting: Obstetrics and Gynecology

## 2014-12-05 NOTE — Telephone Encounter (Signed)
Pt rs for 12/14/14. Closing encounter

## 2014-12-14 ENCOUNTER — Ambulatory Visit (INDEPENDENT_AMBULATORY_CARE_PROVIDER_SITE_OTHER): Payer: BLUE CROSS/BLUE SHIELD | Admitting: Obstetrics and Gynecology

## 2014-12-14 ENCOUNTER — Encounter: Payer: Self-pay | Admitting: Obstetrics and Gynecology

## 2014-12-14 VITALS — BP 110/82 | HR 88 | Ht 61.75 in | Wt 184.2 lb

## 2014-12-14 DIAGNOSIS — N951 Menopausal and female climacteric states: Secondary | ICD-10-CM

## 2014-12-14 NOTE — Progress Notes (Signed)
Patient ID: Maria Gross, female   DOB: August 15, 1960, 54 y.o.   MRN: [id number] GYNECOLOGY  VISIT   HPI: 54 y.o.   Married  Caucasian  female   G1P1001 with No LMP recorded. Patient has had a hysterectomy.   here for follow up. Patient presented 10/05/14 with symptoms of flashes, "can't sleep", night sweats, headaches and irritability.   Started Minivelle 0.05 mg, 1/2 patch twice weekly. No adhesion problems.  When used the whole patch, had breast tenderness.  Sleeping with the assistance of melatonin and alprazolam.   Mood is better.  Has more patience.  Husband noted a difference.   GYNECOLOGIC HISTORY: No LMP recorded. Patient has had a hysterectomy. Contraception:Hysterectomy Menopausal hormone therapy: Minivelle 1/2 patch twice weekly Last mammogram: 02-20-14 density/nl:[facility] Last pap smear: 01-25-14 wnl:neg HR HPV        OB History    Gravida Para Term Preterm AB TAB SAB Ectopic Multiple Living   1 1 1       1          There are no active problems to display for this patient.   Past Medical History  Diagnosis Date  . High cholesterol   . Migraines   . Diverticulitis   . Endometriosis   . Anxiety   . Abnormal Pap smear of cervix     --age 54 had precancerous changes on pap and had hysterectomy  . Anemia     Past Surgical History  Procedure Laterality Date  . Tonsillectomy and adenoidectomy      -age 54  . Abdominal hysterectomy      L-TAH--ovaries remain  . Left kidney blockage Left     age 54 and age 79  . Urethral stent Left     age 29    Current Outpatient Prescriptions  Medication Sig Dispense Refill  . ALPRAZolam (XANAX) 1 MG tablet Take 0.5 mg by mouth at bedtime as needed for anxiety.    Marland Kitchen atorvastatin (LIPITOR) 40 MG tablet Take 40 mg by mouth at bedtime.    . citalopram (CELEXA) 40 MG tablet Take 40 mg by mouth at bedtime.    . Cyanocobalamin (VITAMIN B-12 PO) Take 1 tablet by mouth daily.    Marland Kitchen estradiol (MINIVELLE) 0.05 MG/24HR patch  Place 1 patch (0.05 mg total) onto the skin 2 (two) times a week. Apply anywhere on lower abdomen twice weekly. 24 patch 0  . loratadine (CLARITIN) 10 MG tablet Take 10 mg by mouth daily.    Marland Kitchen MELATONIN PO Take 1 tablet by mouth at bedtime.    . Multiple Vitamin (MULTIVITAMIN) capsule Take 1 capsule by mouth daily.    . polycarbophil (FIBERCON) 625 MG tablet Take 625 mg by mouth daily.    Vladimir Faster Glycol-Propyl Glycol (SYSTANE) 0.4-0.3 % SOLN Place 1 drop into both eyes 2 (two) times daily.     No current facility-administered medications for this visit.     ALLERGIES: Ivp dye  Family History  Problem Relation Age of Onset  . Diabetes Mother 90  . Other Mother     passed away from a blood clot after sinus surgery  . Migraines Mother   . COPD Father 18  . Diabetes Father   . Cancer Maternal Grandfather 22    dec metastatic prostate ca  . Diabetes Sister     History   Social History  . Marital Status: Married    Spouse Name: N/A  . Number of Children: N/A  .  Years of Education: N/A   Occupational History  . Not on file.   Social History Main Topics  . Smoking status: Never Smoker   . Smokeless tobacco: Not on file  . Alcohol Use: 0.0 oz/week    0 Standard drinks or equivalent per week     Comment: rarely wine  . Drug Use: No  . Sexual Activity:    Partners: Male     Comment: Hyst   Other Topics Concern  . Not on file   Social History Narrative    ROS:  Pertinent items are noted in HPI.  PHYSICAL EXAMINATION:    BP 110/82 mmHg  Pulse 88  Ht 5' 1.75" (1.568 m)  Wt 184 lb 3.2 oz (83.553 kg)  BMI 33.98 kg/m2    General appearance: alert, cooperative and appears stated age.  Pleasant affect.   ASSESSMENT  Menopausal symptoms controlled on ERT patch.  Status post TAH.  PLAN  Counseled regarding options for dosing medication.  OK to continue with Minivelle 0.05 mg, 1/2 patch twice weekly for now.  For next Rx, can use Climara 0.025 mg weekly.  Will  return for annual exam and mammogram in July 2016.    An After Visit Summary was printed and given to the patient.  ___15___ minutes face to face time of which over 50% was spent in counseling.

## 2014-12-14 NOTE — Patient Instructions (Signed)
Continue with your estrogen patch 1/2 patch to clean dry skin twice a week.  See you in July for your annual exam.

## 2015-02-01 ENCOUNTER — Encounter: Payer: Self-pay | Admitting: Obstetrics and Gynecology

## 2015-02-01 ENCOUNTER — Ambulatory Visit (INDEPENDENT_AMBULATORY_CARE_PROVIDER_SITE_OTHER): Payer: BLUE CROSS/BLUE SHIELD | Admitting: Obstetrics and Gynecology

## 2015-02-01 ENCOUNTER — Telehealth: Payer: Self-pay | Admitting: *Deleted

## 2015-02-01 ENCOUNTER — Ambulatory Visit: Payer: BC Managed Care – PPO | Admitting: Obstetrics and Gynecology

## 2015-02-01 VITALS — BP 100/64 | HR 80 | Resp 14 | Ht 62.25 in | Wt 182.4 lb

## 2015-02-01 DIAGNOSIS — Z0000 Encounter for general adult medical examination without abnormal findings: Secondary | ICD-10-CM

## 2015-02-01 DIAGNOSIS — Z01419 Encounter for gynecological examination (general) (routine) without abnormal findings: Secondary | ICD-10-CM

## 2015-02-01 DIAGNOSIS — Z23 Encounter for immunization: Secondary | ICD-10-CM

## 2015-02-01 LAB — POCT URINALYSIS DIPSTICK
Bilirubin, UA: NEGATIVE
Blood, UA: NEGATIVE
Glucose, UA: NEGATIVE
Ketones, UA: NEGATIVE
Leukocytes, UA: NEGATIVE
Nitrite, UA: NEGATIVE
Protein, UA: NEGATIVE
Urobilinogen, UA: NEGATIVE
pH, UA: 6

## 2015-02-01 MED ORDER — TETANUS-DIPHTH-ACELL PERTUSSIS 5-2.5-18.5 LF-MCG/0.5 IM SUSP
0.5000 mL | Freq: Once | INTRAMUSCULAR | Status: AC
  Administered 2015-02-01: 0.5 mL via INTRAMUSCULAR

## 2015-02-01 MED ORDER — ESTRADIOL 0.025 MG/24HR TD PTWK: 0.0250 mg | MEDICATED_PATCH | TRANSDERMAL | Status: DC

## 2015-02-01 NOTE — Telephone Encounter (Signed)
-----   Message from Nunzio Cobbs, MD sent at 02/01/2015  9:30 AM EDT ----- Regarding: please call to schedule TDap Please call to schedule TDap vaccine.   She left prior to receiving this.   Thanks,  [place]

## 2015-02-01 NOTE — Progress Notes (Signed)
Patient ID: Maria Gross, female   DOB: June 17, 1961, 54 y.o.   MRN: [id number] 54 y.o. G64P1001 Married Caucasian female here for annual exam.   Uses Vivelle Dot 0.05 mg 1/2 patch twice weekly. Mood swings were to biggest issue for the patient in menopause. Feels great right now.   Notes weight gain and urinary loss. Urge related loss. 3 coffees per day.   PCP:  Sadie Haber at [facility] - labs with PCPZA  No LMP recorded. Patient has had a hysterectomy.          Sexually active: Yes.   female The current method of family planning is status post hysterectomy.   Ovaries remain.  Exercising: No.  none. Smoker:  no  Health Maintenance: Pap:  01-25-14 wnl:neg HR HPV History of abnormal Pap:  Yes, at 54 yrs old had pre-cancerous cells on pap which lead to hysterectomy. MMG:  02-20-14 Density Cat.B/neg:[facility].  Patient will call to schedule. Colonoscopy:  01-09-14 normal with Dr. Penelope Coop except for diverticulitis and internal hemorrhoids.  Next due 12/2023. BMD:   2010  Result  Normal at [facility], [place](free through work) TDaP:  10 years ago Screening Labs:  Hb today: PCP, Urine today: Neg   reports that she has never smoked. She does not have any smokeless tobacco history on file. She reports that she does not drink alcohol or use illicit drugs.  Past Medical History  Diagnosis Date  . High cholesterol   . Migraines   . Diverticulitis   . Endometriosis   . Anxiety   . Abnormal Pap smear of cervix     --age 54 had precancerous changes on pap and had hysterectomy  . Anemia     Past Surgical History  Procedure Laterality Date  . Tonsillectomy and adenoidectomy      -age 54  . Abdominal hysterectomy      L-TAH--ovaries remain  . Left kidney blockage Left     age 54 and age 89  . Urethral stent Left     age 54    Current Outpatient Prescriptions  Medication Sig Dispense Refill  . ALPRAZolam (XANAX) 1 MG tablet Take 0.5 mg by mouth at bedtime as needed for anxiety.    Marland Kitchen  atorvastatin (LIPITOR) 40 MG tablet Take 40 mg by mouth at bedtime.    . citalopram (CELEXA) 40 MG tablet Take 40 mg by mouth at bedtime.    . Cyanocobalamin (VITAMIN B-12 PO) Take 1 tablet by mouth daily.    Marland Kitchen estradiol (MINIVELLE) 0.05 MG/24HR patch Place 1 patch (0.05 mg total) onto the skin 2 (two) times a week. Apply anywhere on lower abdomen twice weekly. 24 patch 0  . loratadine (CLARITIN) 10 MG tablet Take 10 mg by mouth daily.    Marland Kitchen MELATONIN PO Take 1 tablet by mouth at bedtime.    . Multiple Vitamin (MULTIVITAMIN) capsule Take 1 capsule by mouth daily.    . polycarbophil (FIBERCON) 625 MG tablet Take 625 mg by mouth daily.    Vladimir Faster Glycol-Propyl Glycol (SYSTANE) 0.4-0.3 % SOLN Place 1 drop into both eyes 2 (two) times daily.     No current facility-administered medications for this visit.    Family History  Problem Relation Age of Onset  . Diabetes Mother 52  . Other Mother     passed away from a blood clot after sinus surgery  . Migraines Mother   . COPD Father 23  . Diabetes Father   . Cancer Maternal  Grandfather 46    dec metastatic prostate ca  . Diabetes Sister     ROS:  Pertinent items are noted in HPI.  Otherwise, a comprehensive ROS was negative.  Exam:   BP 100/64 mmHg  Pulse 80  Resp 14  Ht 5' 2.25" (1.581 m)  Wt 182 lb 6.4 oz (82.736 kg)  BMI 33.10 kg/m2    General appearance: alert, cooperative and appears stated age Head: Normocephalic, without obvious abnormality, atraumatic Neck: no adenopathy, supple, symmetrical, trachea midline and thyroid normal to inspection and palpation Lungs: clear to auscultation bilaterally Breasts: normal appearance, no masses or tenderness, Inspection negative, No nipple retraction or dimpling, No nipple discharge or bleeding, No axillary or supraclavicular adenopathy Heart: regular rate and rhythm Abdomen: soft, non-tender; bowel sounds normal; no masses,  no organomegaly Extremities: extremities normal,  atraumatic, no cyanosis or edema Skin: Skin color, texture, turgor normal. No rashes or lesions Lymph nodes: Cervical, supraclavicular, and axillary nodes normal. No abnormal inguinal nodes palpated Neurologic: Grossly normal  Pelvic: External genitalia:  no lesions              Urethra:  normal appearing urethra with no masses, tenderness or lesions              Bartholins and Skenes: normal                 Vagina: normal appearing vagina with normal color and discharge, no lesions              Cervix: absent              Pap taken: Yes.   Bimanual Exam:  Uterus:  uterus absent              Adnexa: normal adnexa and no mass, fullness, tenderness              Rectovaginal: Yes.  .  Confirms.              Anus:  normal sphincter tone, no lesions  Chaperone was present for exam.  Assessment:   Well woman visit with normal exam. ERT patient. Urinary urgency.   Plan: Yearly mammogram recommended after age 54. She will call to schedule. Recommended self breast exam.  Pap and HR HPV as above. Discussed Calcium, Vitamin D, regular exercise program including cardiovascular and weight bearing exercise. Labs performed.  No..   See orders. Refills given on medications.  Yes.  .  See orders.  Cliimara 0.025 mg weekly to skin.  Discussed risks of DVT, PE, MI, stroke, breast CA.  Wishes to continue.  Discussed discontinuation of bladder irritants. TDap recommended. Follow up annually and prn.      After visit summary provided.

## 2015-02-01 NOTE — Telephone Encounter (Signed)
Patient says she will stop by office today and ask for me to have tdap given.

## 2015-02-01 NOTE — Patient Instructions (Signed)

## 2015-02-01 NOTE — Telephone Encounter (Signed)
Left Message To Call Back  

## 2015-02-01 NOTE — Addendum Note (Signed)
Addended by: Gilberto Better on: 02/01/2015 02:59 PM   Modules accepted: Orders, SmartSet

## 2015-02-06 LAB — IPS PAP TEST WITH HPV

## 2015-07-18 ENCOUNTER — Other Ambulatory Visit: Payer: Self-pay

## 2015-07-18 DIAGNOSIS — Z1231 Encounter for screening mammogram for malignant neoplasm of breast: Secondary | ICD-10-CM

## 2015-08-14 ENCOUNTER — Ambulatory Visit
Admission: RE | Admit: 2015-08-14 | Discharge: 2015-08-14 | Disposition: A | Discharge status: Home / Self Care | Payer: BLUE CROSS/BLUE SHIELD | Source: Ambulatory Visit

## 2015-08-14 DIAGNOSIS — Z1231 Encounter for screening mammogram for malignant neoplasm of breast: Secondary | ICD-10-CM

## 2016-02-20 NOTE — Progress Notes (Signed)
55 y.o. [id number] Married Caucasian female here for annual exam.    Patient is on a Climara patch 0.025 mg weekly.  Is cutting the patch in half and using it once a week.  Having a reaction to the adhesive.  Not having hot flashes. Feels it is helping her emotionally.  Wants to continue.   Had hysterectomy for cervical dysplasia.  Was told it was for "the rapid growing kind."  PCP:   [facility]  No LMP recorded. Patient has had a hysterectomy.           Sexually active: Yes.    The current method of family planning is status post hysterectomy--ovaries remain.    Exercising: No.  The patient does not participate in regular exercise at present. Smoker:  no  Health Maintenance: Pap:  02-01-15 Neg:Neg HR HPV History of abnormal Pap:  Yes, at 55 yrs old had pre-cancerous cells on pap which lead to hysterectomy. MMG:  08-14-15 3D/Density B/Neg/Birads1:[facility] Colonoscopy:  01-09-14 normal with Dr. Penelope Coop except for diverticulitis and internal hemorrhoids;next due 12/2023. BMD:   2010  Result  Normal at [facility] thru wk) TDaP:  02-01-15 HIV:  PCP Hep C:  PCP Screening Labs:  Hb today: PCP, Urine today: normal   reports that she has never smoked. She has never used smokeless tobacco. She reports that she does not drink alcohol or use drugs.  Past Medical History:  Diagnosis Date  . Abnormal Pap smear of cervix    --age 67 had precancerous changes on pap and had hysterectomy  . Anemia   . Anxiety   . Diverticulitis   . Endometriosis   . High cholesterol   . Migraines     Past Surgical History:  Procedure Laterality Date  . ABDOMINAL HYSTERECTOMY     L-TAH--ovaries remain  . left kidney blockage Left    age 77 and age 79  . TONSILLECTOMY AND ADENOIDECTOMY     -age 63  . urethral stent Left    age 21    Current Outpatient Prescriptions  Medication Sig Dispense Refill  . ALPRAZolam (XANAX) 1 MG tablet Take 0.5 mg by mouth at bedtime as needed for anxiety.     Marland Kitchen atorvastatin (LIPITOR) 40 MG tablet Take 40 mg by mouth at bedtime.    . citalopram (CELEXA) 40 MG tablet Take 40 mg by mouth at bedtime.    . Cyanocobalamin (VITAMIN B-12 PO) Take 1 tablet by mouth daily.    Marland Kitchen estradiol (CLIMARA - DOSED IN MG/24 HR) 0.025 mg/24hr patch Place 1 patch (0.025 mg total) onto the skin once a week. 12 patch 3  . loratadine (CLARITIN) 10 MG tablet Take 10 mg by mouth daily.    Marland Kitchen MELATONIN PO Take 1 tablet by mouth at bedtime.    . Multiple Vitamin (MULTIVITAMIN) capsule Take 1 capsule by mouth daily.    . polycarbophil (FIBERCON) 625 MG tablet Take 625 mg by mouth daily.    Vladimir Faster Glycol-Propyl Glycol (SYSTANE) 0.4-0.3 % SOLN Place 1 drop into both eyes 2 (two) times daily.     No current facility-administered medications for this visit.     Family History  Problem Relation Age of Onset  . Diabetes Mother 72  . Other Mother     passed away from a blood clot after sinus surgery  . Migraines Mother   . COPD Father 70  . Diabetes Father   . Diabetes Sister   . Cancer Maternal Grandfather  53    dec metastatic prostate ca    ROS:  Pertinent items are noted in HPI.  Otherwise, a comprehensive ROS was negative.  Exam:   BP 110/62 (BP Location: Right Arm, Patient Position: Sitting, Cuff Size: Normal)   Pulse 88   Resp 16   Ht 5\' 2"  (1.575 m)   Wt 176 lb (79.8 kg)   BMI 32.19 kg/m     General appearance: alert, cooperative and appears stated age Head: Normocephalic, without obvious abnormality, atraumatic Neck: no adenopathy, supple, symmetrical, trachea midline and thyroid normal to inspection and palpation Lungs: clear to auscultation bilaterally Breasts: normal appearance, no masses or tenderness, Inspection negative, No nipple retraction or dimpling, No nipple discharge or bleeding, No axillary or supraclavicular adenopathy Heart: regular rate and rhythm Abdomen:  soft, non-tender; no masses, no organomegaly Extremities: extremities normal,  atraumatic, no cyanosis or edema Skin: Skin color, texture, turgor normal. No rashes or lesions Lymph nodes: Cervical, supraclavicular, and axillary nodes normal. No abnormal inguinal nodes palpated Neurologic: Grossly normal  Pelvic: External genitalia:  no lesions              Urethra:  normal appearing urethra with no masses, tenderness or lesions              Bartholins and Skenes: normal                 Vagina: normal appearing vagina with normal color and discharge, no lesions              Cervix: absent              Pap taken: Yes.   Bimanual Exam:  Uterus:  uterus absent              Adnexa: no mass, fullness, tenderness              Rectal exam: Yes.  .  Confirms.              Anus:  normal sphincter tone, no lesions  Chaperone was present for exam.  Assessment:   Well woman visit with normal exam. Status post hysterectomy for cervical dysplasia.  Ovaries remain.  On ERT.  Plan: Yearly mammogram recommended after age 33.  Recommended self breast exam.  Pap and HR HPV as above. Discussed Calcium, Vitamin D, regular exercise program including cardiovascular and weight bearing exercise. Labs performed.  No..   See orders. Prescription medication(s) given.  Yes.  .  See orders.  Minivelle 0.0375 twice weekly. Discussed risks of DVT, PE, stroke and possible MI and breast cancer.  She wishes to continue. Will get op report and pathology report for hysterectomy.   Follow up annually and prn.      After visit summary provided.

## 2016-02-21 ENCOUNTER — Encounter: Payer: Self-pay | Admitting: Obstetrics and Gynecology

## 2016-02-21 ENCOUNTER — Ambulatory Visit (INDEPENDENT_AMBULATORY_CARE_PROVIDER_SITE_OTHER): Payer: BLUE CROSS/BLUE SHIELD | Admitting: Obstetrics and Gynecology

## 2016-02-21 VITALS — BP 110/62 | HR 88 | Resp 16 | Ht 62.0 in | Wt 176.0 lb

## 2016-02-21 DIAGNOSIS — Z79899 Other long term (current) drug therapy: Secondary | ICD-10-CM

## 2016-02-21 DIAGNOSIS — Z0000 Encounter for general adult medical examination without abnormal findings: Secondary | ICD-10-CM | POA: Diagnosis not present

## 2016-02-21 DIAGNOSIS — Z01419 Encounter for gynecological examination (general) (routine) without abnormal findings: Secondary | ICD-10-CM | POA: Diagnosis not present

## 2016-02-21 LAB — POCT URINALYSIS DIPSTICK
Bilirubin, UA: NEGATIVE
Blood, UA: NEGATIVE
Glucose, UA: NEGATIVE
Ketones, UA: NEGATIVE
Leukocytes, UA: NEGATIVE
Nitrite, UA: NEGATIVE
Protein, UA: NEGATIVE
Urobilinogen, UA: NEGATIVE
pH, UA: 5

## 2016-02-21 MED ORDER — ESTRADIOL 0.0375 MG/24HR TD PTTW: 1.0000 | MEDICATED_PATCH | TRANSDERMAL | 3 refills | Status: DC

## 2016-02-21 NOTE — Patient Instructions (Signed)
Health Maintenance, Female Adopting a healthy lifestyle and getting preventive care can go a long way to promote health and wellness. Talk with your health care provider about what schedule of regular examinations is right for you. This is a good chance for you to check in with your provider about disease prevention and staying healthy. In between checkups, there are plenty of things you can do on your own. Experts have done a lot of research about which lifestyle changes and preventive measures are most likely to keep you healthy. Ask your health care provider for more information. WEIGHT AND DIET  Eat a healthy diet  Be sure to include plenty of vegetables, fruits, low-fat dairy products, and lean protein.  Do not eat a lot of foods high in solid fats, added sugars, or salt.  Get regular exercise. This is one of the most important things you can do for your health.  Most adults should exercise for at least 150 minutes each week. The exercise should increase your heart rate and make you sweat (moderate-intensity exercise).  Most adults should also do strengthening exercises at least twice a week. This is in addition to the moderate-intensity exercise.  Maintain a healthy weight  Body mass index (BMI) is a measurement that can be used to identify possible weight problems. It estimates body fat based on height and weight. Your health care provider can help determine your BMI and help you achieve or maintain a healthy weight.  For females 20 years of age and older:   A BMI below 18.5 is considered underweight.  A BMI of 18.5 to 24.9 is normal.  A BMI of 25 to 29.9 is considered overweight.  A BMI of 30 and above is considered obese.  Watch levels of cholesterol and blood lipids  You should start having your blood tested for lipids and cholesterol at 54 years of age, then have this test every 5 years.  You may need to have your cholesterol levels checked more often if:  Your lipid  or cholesterol levels are high.  You are older than 54 years of age.  You are at high risk for heart disease.  CANCER SCREENING   Lung Cancer  Lung cancer screening is recommended for adults 55-80 years old who are at high risk for lung cancer because of a history of smoking.  A yearly low-dose CT scan of the lungs is recommended for people who:  Currently smoke.  Have quit within the past 15 years.  Have at least a 30-pack-year history of smoking. A pack year is smoking an average of one pack of cigarettes a day for 1 year.  Yearly screening should continue until it has been 15 years since you quit.  Yearly screening should stop if you develop a health problem that would prevent you from having lung cancer treatment.  Breast Cancer  Practice breast self-awareness. This means understanding how your breasts normally appear and feel.  It also means doing regular breast self-exams. Let your health care provider know about any changes, no matter how small.  If you are in your 20s or 30s, you should have a clinical breast exam (CBE) by a health care provider every 1-3 years as part of a regular health exam.  If you are 40 or older, have a CBE every year. Also consider having a breast X-ray (mammogram) every year.  If you have a family history of breast cancer, talk to your health care provider about genetic screening.  If you   are at high risk for breast cancer, talk to your health care provider about having an MRI and a mammogram every year.  Breast cancer gene (BRCA) assessment is recommended for women who have family members with BRCA-related cancers. BRCA-related cancers include:  Breast.  Ovarian.  Tubal.  Peritoneal cancers.  Results of the assessment will determine the need for genetic counseling and BRCA1 and BRCA2 testing. Cervical Cancer Your health care provider may recommend that you be screened regularly for cancer of the pelvic organs (ovaries, uterus, and  vagina). This screening involves a pelvic examination, including checking for microscopic changes to the surface of your cervix (Pap test). You may be encouraged to have this screening done every 3 years, beginning at age 21.  For women ages 30-65, health care providers may recommend pelvic exams and Pap testing every 3 years, or they may recommend the Pap and pelvic exam, combined with testing for human papilloma virus (HPV), every 5 years. Some types of HPV increase your risk of cervical cancer. Testing for HPV may also be done on women of any age with unclear Pap test results.  Other health care providers may not recommend any screening for nonpregnant women who are considered low risk for pelvic cancer and who do not have symptoms. Ask your health care provider if a screening pelvic exam is right for you.  If you have had past treatment for cervical cancer or a condition that could lead to cancer, you need Pap tests and screening for cancer for at least 20 years after your treatment. If Pap tests have been discontinued, your risk factors (such as having a new sexual partner) need to be reassessed to determine if screening should resume. Some women have medical problems that increase the chance of getting cervical cancer. In these cases, your health care provider may recommend more frequent screening and Pap tests. Colorectal Cancer  This type of cancer can be detected and often prevented.  Routine colorectal cancer screening usually begins at 54 years of age and continues through 54 years of age.  Your health care provider may recommend screening at an earlier age if you have risk factors for colon cancer.  Your health care provider may also recommend using home test kits to check for hidden blood in the stool.  A small camera at the end of a tube can be used to examine your colon directly (sigmoidoscopy or colonoscopy). This is done to check for the earliest forms of colorectal  cancer.  Routine screening usually begins at age 50.  Direct examination of the colon should be repeated every 5-10 years through 54 years of age. However, you may need to be screened more often if early forms of precancerous polyps or small growths are found. Skin Cancer  Check your skin from head to toe regularly.  Tell your health care provider about any new moles or changes in moles, especially if there is a change in a mole's shape or color.  Also tell your health care provider if you have a mole that is larger than the size of a pencil eraser.  Always use sunscreen. Apply sunscreen liberally and repeatedly throughout the day.  Protect yourself by wearing long sleeves, pants, a wide-brimmed hat, and sunglasses whenever you are outside. HEART DISEASE, DIABETES, AND HIGH BLOOD PRESSURE   High blood pressure causes heart disease and increases the risk of stroke. High blood pressure is more likely to develop in:  People who have blood pressure in the high end   of the normal range (130-139/85-89 mm Hg).  People who are overweight or obese.  People who are African American.  If you are 38-23 years of age, have your blood pressure checked every 3-5 years. If you are 61 years of age or older, have your blood pressure checked every year. You should have your blood pressure measured twice--once when you are at a hospital or clinic, and once when you are not at a hospital or clinic. Record the average of the two measurements. To check your blood pressure when you are not at a hospital or clinic, you can use:  An automated blood pressure machine at a pharmacy.  A home blood pressure monitor.  If you are between 45 years and 39 years old, ask your health care provider if you should take aspirin to prevent strokes.  Have regular diabetes screenings. This involves taking a blood sample to check your fasting blood sugar level.  If you are at a normal weight and have a low risk for diabetes,  have this test once every three years after 54 years of age.  If you are overweight and have a high risk for diabetes, consider being tested at a younger age or more often. PREVENTING INFECTION  Hepatitis B  If you have a higher risk for hepatitis B, you should be screened for this virus. You are considered at high risk for hepatitis B if:  You were born in a country where hepatitis B is common. Ask your health care provider which countries are considered high risk.  Your parents were born in a high-risk country, and you have not been immunized against hepatitis B (hepatitis B vaccine).  You have HIV or AIDS.  You use needles to inject street drugs.  You live with someone who has hepatitis B.  You have had sex with someone who has hepatitis B.  You get hemodialysis treatment.  You take certain medicines for conditions, including cancer, organ transplantation, and autoimmune conditions. Hepatitis C  Blood testing is recommended for:  Everyone born from [birth year] through 1964.  Anyone with known risk factors for hepatitis C. Sexually transmitted infections (STIs)  You should be screened for sexually transmitted infections (STIs) including gonorrhea and chlamydia if:  You are sexually active and are younger than 54 years of age.  You are older than 53 years of age and your health care provider tells you that you are at risk for this type of infection.  Your sexual activity has changed since you were last screened and you are at an increased risk for chlamydia or gonorrhea. Ask your health care provider if you are at risk.  If you do not have HIV, but are at risk, it may be recommended that you take a prescription medicine daily to prevent HIV infection. This is called pre-exposure prophylaxis (PrEP). You are considered at risk if:  You are sexually active and do not regularly use condoms or know the HIV status of your partner(s).  You take drugs by injection.  You are sexually  active with a partner who has HIV. Talk with your health care provider about whether you are at high risk of being infected with HIV. If you choose to begin PrEP, you should first be tested for HIV. You should then be tested every 3 months for as long as you are taking PrEP.  PREGNANCY   If you are premenopausal and you may become pregnant, ask your health care provider about preconception counseling.  If you may  become pregnant, take 400 to 800 micrograms (mcg) of folic acid every day.  If you want to prevent pregnancy, talk to your health care provider about birth control (contraception). OSTEOPOROSIS AND MENOPAUSE   Osteoporosis is a disease in which the bones lose minerals and strength with aging. This can result in serious bone fractures. Your risk for osteoporosis can be identified using a bone density scan.  If you are 61 years of age or older, or if you are at risk for osteoporosis and fractures, ask your health care provider if you should be screened.  Ask your health care provider whether you should take a calcium or vitamin D supplement to lower your risk for osteoporosis.  Menopause may have certain physical symptoms and risks.  Hormone replacement therapy may reduce some of these symptoms and risks. Talk to your health care provider about whether hormone replacement therapy is right for you.  HOME CARE INSTRUCTIONS   Schedule regular health, dental, and eye exams.  Stay current with your immunizations.   Do not use any tobacco products including cigarettes, chewing tobacco, or electronic cigarettes.  If you are pregnant, do not drink alcohol.  If you are breastfeeding, limit how much and how often you drink alcohol.  Limit alcohol intake to no more than 1 drink per day for nonpregnant women. One drink equals 12 ounces of beer, 5 ounces of wine, or 1 ounces of hard liquor.  Do not use street drugs.  Do not share needles.  Ask your health care provider for help if  you need support or information about quitting drugs.  Tell your health care provider if you often feel depressed.  Tell your health care provider if you have ever been abused or do not feel safe at home.   This information is not intended to replace advice given to you by your health care provider. Make sure you discuss any questions you have with your health care provider.   Document Released: 01/26/2011 Document Revised: 08/03/2014 Document Reviewed: 06/14/2013 Elsevier Interactive Patient Education [employer].

## 2016-02-24 LAB — IPS PAP TEST WITH HPV

## 2016-04-07 ENCOUNTER — Telehealth: Payer: Self-pay | Admitting: Obstetrics and Gynecology

## 2016-04-07 NOTE — Telephone Encounter (Signed)
Returned call to patient. Patient states that she was seen here in July for her AEX and Dr. Quincy Simmonds changed her patch to a smaller one that she changes twice weekly, due to skin irritation with previous patch. Patient states, "I don't think this one is as effective, or as strong as the other patch. I'm beginning to have symptoms again like anxiety, and I can't deal with things as I normally do." She also states she has hot flashes that mostly occur at night. Patient states she recently had lab work done at [place] on [address] if our office needs those. Patient declines office visit, and states she doesn't really see the need to be seen, that she thinks Dr. Quincy Simmonds can just switch what she is on. RN advised this message would be sent to Dr. Quincy Simmonds and our office would return call with any additional recommendations. Patient agreeable and aware Dr. Quincy Simmonds is out of the office today.    Routing to provider for review.

## 2016-04-07 NOTE — Telephone Encounter (Signed)
The new estrogen patch is actually stronger 0.0375 mg instead of 0.025 mg.  May I request the patient come in for an office with with me?

## 2016-04-07 NOTE — Telephone Encounter (Signed)
Patient wants to speak with the nurse. No information given. °

## 2016-04-08 NOTE — Telephone Encounter (Signed)
Thank you for the update.  I have closed the encounter.  

## 2016-04-08 NOTE — Telephone Encounter (Signed)
Spoke with patient and gave message from Dr. Quincy Simmonds. Patient  states that "after talking with Raquel Sarna I got to thinking that I've only been on this new dose for a couple weeks. I finished out my old dose till it was gone. I think I need to give it some more time." She also said that she and her husband are in the middle of buying a new home and she has had increased stress from that. She declined an appointment at this time and wants to give her new dose a little more time. She will call the office if she needs anything  sending to Dr. Quincy Simmonds for review. -eh

## 2016-10-08 ENCOUNTER — Other Ambulatory Visit: Payer: Self-pay | Admitting: Obstetrics and Gynecology

## 2016-10-08 DIAGNOSIS — Z1231 Encounter for screening mammogram for malignant neoplasm of breast: Secondary | ICD-10-CM

## 2016-10-27 ENCOUNTER — Ambulatory Visit
Admission: RE | Admit: 2016-10-27 | Discharge: 2016-10-27 | Disposition: A | Discharge status: Home / Self Care | Payer: BLUE CROSS/BLUE SHIELD | Source: Ambulatory Visit | Attending: Obstetrics and Gynecology | Admitting: Obstetrics and Gynecology

## 2016-10-27 DIAGNOSIS — Z1231 Encounter for screening mammogram for malignant neoplasm of breast: Secondary | ICD-10-CM | POA: Diagnosis not present

## 2016-10-28 ENCOUNTER — Other Ambulatory Visit: Payer: Self-pay | Admitting: Obstetrics and Gynecology

## 2016-10-28 NOTE — Telephone Encounter (Signed)
Medication refill request: estradiol  Last AEX:  02-21-16  Next AEX: 03-19-17  Last MMG (if hormonal medication request): 10-27-16 WNL  Refill authorized: please advise

## 2017-02-20 ENCOUNTER — Other Ambulatory Visit: Payer: Self-pay | Admitting: Obstetrics and Gynecology

## 2017-02-22 NOTE — Telephone Encounter (Signed)
Medication refill request: Vilvelle Patch  Last AEX:  02-21-16  Next AEX: 03-19-17  Last MMG (if hormonal medication request): 10-27-16 WNL  Refill authorized: please advise

## 2017-03-16 DIAGNOSIS — R7301 Impaired fasting glucose: Secondary | ICD-10-CM | POA: Diagnosis not present

## 2017-03-16 DIAGNOSIS — Z0000 Encounter for general adult medical examination without abnormal findings: Secondary | ICD-10-CM | POA: Diagnosis not present

## 2017-03-16 DIAGNOSIS — E7800 Pure hypercholesterolemia, unspecified: Secondary | ICD-10-CM | POA: Diagnosis not present

## 2017-03-18 NOTE — Progress Notes (Signed)
56 y.o. G38P1001 Married Caucasian female here for annual exam.    On ERT. Hot flashes are controlled.   Now wearing a wig.   PCP:  Milagros Evener, MD   No LMP recorded. Patient has had a hysterectomy.           Sexually active: Yes.   female The current method of family planning is status post hysterectomy. Ovaries remain.   Exercising: No.   Smoker:  no  Health Maintenance: Pap: 02-11-16 Neg:Neg HR HPV; 02-01-15 Neg:Neg HR HPV History of abnormal Pap:  Yes, at 56 y.o. had pre-cancerous cells on pap which lead to hysterectomy. MMG: 10-27-16 Density B/Neg/BiRads1:TBC Colonoscopy:  01-09-14 normal with Dr. Penelope Coop except for diverticulitis and internal hemorrhoids;next due 12/2023. BMD: 2010  Result:  Normal at [facility] thru wk) TDaP:  02-01-15 Gardasil:   no HIV: PCP Hep C: PCP Screening Labs:  Hb today: PCP, Urine today: not done   reports that she has never smoked. She has never used smokeless tobacco. She reports that she drinks about 0.6 oz of alcohol per week . She reports that she does not use drugs.  Past Medical History:  Diagnosis Date  . Abnormal Pap smear of cervix    --age 33 had precancerous changes on pap and had hysterectomy  . Alopecia   . Anemia   . Anxiety   . Diverticulitis   . Endometriosis   . High cholesterol   . Migraines     Past Surgical History:  Procedure Laterality Date  . ABDOMINAL HYSTERECTOMY     L-TAH--ovaries remain  . left kidney blockage Left    age 45 and age 42  . TONSILLECTOMY AND ADENOIDECTOMY     -age 35  . urethral stent Left    age 5    Current Outpatient Prescriptions  Medication Sig Dispense Refill  . ALPRAZolam (XANAX) 1 MG tablet Take 0.5 mg by mouth at bedtime as needed for anxiety.    Marland Kitchen atorvastatin (LIPITOR) 40 MG tablet Take 40 mg by mouth at bedtime.    . citalopram (CELEXA) 40 MG tablet Take 40 mg by mouth at bedtime.    . Cyanocobalamin (VITAMIN B-12 PO) Take 1 tablet by mouth daily.    Marland Kitchen estradiol  (VIVELLE-DOT) 0.0375 MG/24HR PLACE 1 PATCH ANYWHERE ON LOWER ABDOMEN AND CHANGE TWICE WEEKLY 24 patch 3  . loratadine (CLARITIN) 10 MG tablet Take 10 mg by mouth daily.    Marland Kitchen MELATONIN PO Take 1 tablet by mouth at bedtime.    . Multiple Vitamin (MULTIVITAMIN) capsule Take 1 capsule by mouth daily.    . polycarbophil (FIBERCON) 625 MG tablet Take 625 mg by mouth daily.    Vladimir Faster Glycol-Propyl Glycol (SYSTANE) 0.4-0.3 % SOLN Place 1 drop into both eyes 2 (two) times daily.    . SUMAtriptan (IMITREX) 50 MG tablet Take 1 tablet by mouth as needed.  3   No current facility-administered medications for this visit.     Family History  Problem Relation Age of Onset  . Diabetes Mother 52  . Other Mother        passed away from a blood clot after sinus surgery  . Migraines Mother   . COPD Father 54  . Diabetes Father   . Diabetes Sister   . Cancer Maternal Grandfather 24       dec metastatic prostate ca  . Breast cancer Paternal Aunt   . Breast cancer Cousin   . Cancer Cousin  ovarian cancer  . Cancer Paternal Aunt        uterine    ROS:  Pertinent items are noted in HPI.  Otherwise, a comprehensive ROS was negative.  Exam:   BP 140/72 (BP Location: Right Arm, Patient Position: Sitting, Cuff Size: Small)   Pulse 88   Resp 14   Ht 5' 2.8" (1.595 m)   Wt 172 lb 12.8 oz (78.4 kg)   BMI 30.81 kg/m     General appearance: alert, cooperative and appears stated age Head: Normocephalic, without obvious abnormality, atraumatic Neck: no adenopathy, supple, symmetrical, trachea midline and thyroid normal to inspection and palpation Lungs: clear to auscultation bilaterally Breasts: normal appearance, no masses or tenderness, No nipple retraction or dimpling, No nipple discharge or bleeding, No axillary or supraclavicular adenopathy Heart: regular rate and rhythm Abdomen: soft, non-tender; no masses, no organomegaly Extremities: extremities normal, atraumatic, no cyanosis or  edema Skin: Skin color, texture, turgor normal. No rashes or lesions Lymph nodes: Cervical, supraclavicular, and axillary nodes normal. No abnormal inguinal nodes palpated Neurologic: Grossly normal  Pelvic: External genitalia:  no lesions              Urethra:  normal appearing urethra with no masses, tenderness or lesions              Bartholins and Skenes: normal                 Vagina: normal appearing vagina with normal color and discharge, no lesions              Cervix:  Absent.              Pap taken: Yes.   Bimanual Exam:  Uterus:   absent              Adnexa: no mass, fullness, tenderness              Rectal exam: Yes.  .  Confirms.              Anus:  normal sphincter tone, no lesions  Chaperone was present for exam.  Assessment:   Well woman visit with normal exam. Status post hysterectomy for cervical dysplasia age 55.  Ovaries remain.  ERT.  Alopecia. Paternal family with cancer history.   Plan: Mammogram screening discussed. Recommended self breast awareness. Pap and HR HPV as above. Guidelines for Calcium, Vitamin D, regular exercise program including cardiovascular and weight bearing exercise. Discussed genetic counseling/testing.  She will consider.  Refill of Vivelle Dot for one year.  Discussed increased risk of DVT, PE, stroke, and possible breast cancer.  Referral to dermatology. Labs with PCP.  Follow up annually and prn.   After visit summary provided.

## 2017-03-19 ENCOUNTER — Ambulatory Visit (INDEPENDENT_AMBULATORY_CARE_PROVIDER_SITE_OTHER): Payer: BLUE CROSS/BLUE SHIELD | Admitting: Obstetrics and Gynecology

## 2017-03-19 ENCOUNTER — Encounter: Payer: Self-pay | Admitting: Obstetrics and Gynecology

## 2017-03-19 ENCOUNTER — Other Ambulatory Visit (HOSPITAL_COMMUNITY)
Admission: RE | Admit: 2017-03-19 | Discharge: 2017-03-19 | Disposition: A | Discharge status: Home / Self Care | Payer: BLUE CROSS/BLUE SHIELD | Source: Ambulatory Visit | Attending: Obstetrics and Gynecology | Admitting: Obstetrics and Gynecology

## 2017-03-19 VITALS — BP 140/72 | HR 88 | Resp 14 | Ht 62.8 in | Wt 172.8 lb

## 2017-03-19 DIAGNOSIS — L659 Nonscarring hair loss, unspecified: Secondary | ICD-10-CM

## 2017-03-19 DIAGNOSIS — Z01419 Encounter for gynecological examination (general) (routine) without abnormal findings: Secondary | ICD-10-CM | POA: Diagnosis not present

## 2017-03-19 MED ORDER — ESTRADIOL 0.0375 MG/24HR TD PTTW: MEDICATED_PATCH | TRANSDERMAL | 3 refills | Status: DC

## 2017-03-19 NOTE — Patient Instructions (Signed)

## 2017-03-22 ENCOUNTER — Telehealth: Payer: Self-pay | Admitting: Obstetrics and Gynecology

## 2017-03-22 LAB — BASIC METABOLIC PANEL
BUN: 11 (ref 4–21)
Creatinine: 0.8 (ref 0.5–1.1)
Glucose: 102
Potassium: 4.1 (ref 3.4–5.3)
Sodium: 136 — AB (ref 137–147)

## 2017-03-22 LAB — CBC AND DIFFERENTIAL
HCT: 40 (ref 36–46)
Hemoglobin: 13.8 (ref 12.0–16.0)
Platelets: 213 (ref 150–399)
WBC: 5.6

## 2017-03-22 LAB — CYTOLOGY - PAP
Diagnosis: NEGATIVE
HPV: NOT DETECTED

## 2017-03-22 LAB — LIPID PANEL
Cholesterol: 146 (ref 0–200)
Triglycerides: 110 (ref 40–160)

## 2017-03-22 LAB — HEPATIC FUNCTION PANEL
ALT: 27 (ref 7–35)
AST: 19 (ref 13–35)

## 2017-03-22 NOTE — Telephone Encounter (Signed)
Left voicemail regarding referral appointment. The information is listed below. Should the patient need to cancel or reschedule this appointment, please advise them to call the office they've been referred to in order to reschedule.  [facility] [place]  Phone: [phone]  Dr Elvera Lennox 05-13-17 @ 10:20am. Please arrive 15 minutes early and bring your insurance card, photo id and list of medications.

## 2017-03-30 ENCOUNTER — Telehealth: Payer: Self-pay | Admitting: Obstetrics and Gynecology

## 2017-03-30 DIAGNOSIS — Z803 Family history of malignant neoplasm of breast: Secondary | ICD-10-CM

## 2017-03-30 DIAGNOSIS — Z8041 Family history of malignant neoplasm of ovary: Secondary | ICD-10-CM

## 2017-03-30 DIAGNOSIS — Z8049 Family history of malignant neoplasm of other genital organs: Secondary | ICD-10-CM

## 2017-03-30 NOTE — Telephone Encounter (Signed)
Referral placed to [facility] for genetics counseling/testing for [facility] of breast cancer, uterine cancer, and ovarian cancer. Spoke with patient who is agreeable to referral. Advised [facility] will contact her directly to schedule an appointment. Patient is agreeable.  Routing to covering provider for final review. Patient agreeable to disposition. Will close encounter.

## 2017-03-30 NOTE — Telephone Encounter (Signed)
Patient states she spoke with Dr Quincy Simmonds about genetics testing.  Patient has given it some thought and wishes to proceed with the referral.

## 2017-04-08 ENCOUNTER — Telehealth: Payer: Self-pay | Admitting: Genetic Counselor

## 2017-04-08 ENCOUNTER — Encounter: Payer: Self-pay | Admitting: Genetic Counselor

## 2017-04-08 NOTE — Telephone Encounter (Signed)
Genetic counseling appt has been scheduled for the pt to see Santiago Glad on 10/3 at 1pm.

## 2017-04-28 ENCOUNTER — Encounter: Payer: BLUE CROSS/BLUE SHIELD | Admitting: Genetic Counselor

## 2017-04-28 ENCOUNTER — Other Ambulatory Visit: Payer: BLUE CROSS/BLUE SHIELD

## 2017-05-10 ENCOUNTER — Other Ambulatory Visit: Payer: BLUE CROSS/BLUE SHIELD

## 2017-05-10 ENCOUNTER — Encounter: Payer: Self-pay | Admitting: Genetic Counselor

## 2017-05-10 ENCOUNTER — Ambulatory Visit (HOSPITAL_BASED_OUTPATIENT_CLINIC_OR_DEPARTMENT_OTHER): Payer: BLUE CROSS/BLUE SHIELD | Admitting: Genetic Counselor

## 2017-05-10 DIAGNOSIS — Z8041 Family history of malignant neoplasm of ovary: Secondary | ICD-10-CM

## 2017-05-10 DIAGNOSIS — Z8042 Family history of malignant neoplasm of prostate: Secondary | ICD-10-CM | POA: Insufficient documentation

## 2017-05-10 DIAGNOSIS — C55 Malignant neoplasm of uterus, part unspecified: Secondary | ICD-10-CM | POA: Diagnosis not present

## 2017-05-10 DIAGNOSIS — Z8051 Family history of malignant neoplasm of kidney: Secondary | ICD-10-CM | POA: Diagnosis not present

## 2017-05-10 DIAGNOSIS — Z8049 Family history of malignant neoplasm of other genital organs: Secondary | ICD-10-CM

## 2017-05-10 DIAGNOSIS — Z7183 Encounter for nonprocreative genetic counseling: Secondary | ICD-10-CM

## 2017-05-10 NOTE — Progress Notes (Signed)
REFERRING PROVIDER: Aretta Nip, MD [place], [place] [address]  PRIMARY PROVIDER:  Aretta Nip, MD  PRIMARY REASON FOR VISIT:  1. Family history of uterine cancer   2. Family history of ovarian cancer   3. Family history of kidney cancer   4. Family history of prostate cancer   5. Malignant neoplasm of uterus, unspecified site [facility])      HISTORY OF PRESENT ILLNESS:   Maria Gross, a 56 y.o. female, was seen for a [facility] cancer genetics consultation at the request of Dr. Radene Ou due to a personal and family history of cancer.  Maria Gross presents to clinic today to discuss the possibility of a hereditary predisposition to cancer, genetic testing, and to further clarify her future cancer risks, as well as potential cancer risks for family members.   In 1998, at the age of 44, Maria Gross was diagnosed with uterine cancer. This was treated with surgery.  She did not need chemotherapy.       CANCER HISTORY:   No history exists.     HORMONAL RISK FACTORS:  Menarche was at age 47.  First live birth at age 61.  OCP use for approximately 10 years.  Ovaries intact: yes.  Hysterectomy: yes.  Menopausal status: postmenopausal.  HRT use: 2 years. Colonoscopy: yes; normal. Mammogram within the last year: yes. Number of breast biopsies: 0. Up to date with pelvic exams:  yes. Any excessive radiation exposure in the past:  no  Past Medical History:  Diagnosis Date  . Abnormal Pap smear of cervix    --age 74 had precancerous changes on pap and had hysterectomy  . Alopecia   . Anemia   . Anxiety   . Diverticulitis   . Endometriosis   . Family history of kidney cancer   . Family history of ovarian cancer   . Family history of prostate cancer   . Family history of uterine cancer   . High cholesterol   . Migraines   . Uterine cancer ([place])    dx at 11    Past Surgical History:  Procedure Laterality Date  . ABDOMINAL HYSTERECTOMY     L-TAH--ovaries remain  . left kidney blockage Left    age 72 and age 50  . TONSILLECTOMY AND ADENOIDECTOMY     -age 42  . urethral stent Left    age 67    Social History   Social History  . Marital status: Married    Spouse name: N/A  . Number of children: N/A  . Years of education: N/A   Social History Main Topics  . Smoking status: Never Smoker  . Smokeless tobacco: Never Used  . Alcohol use 0.6 oz/week    1 Glasses of wine per week  . Drug use: No  . Sexual activity: Yes    Partners: Male     Comment: Hyst   Other Topics Concern  . None   Social History Narrative  . None     FAMILY HISTORY:  We obtained a detailed, 4-generation family history.  Significant diagnoses are listed below: Family History  Problem Relation Age of Onset  . Diabetes Mother 61  . Other Mother        passed away from a blood clot after sinus surgery  . Migraines Mother   . COPD Father 78  . Diabetes Father   . Diabetes Sister   . Cancer Maternal Grandfather 57       dec  metastatic prostate ca  . Leukemia Paternal Aunt   . Cancer Cousin        uterine and ovarian  . Cancer Cousin        uterine and ovarian  . Kidney cancer Paternal Uncle        dx in his late 66s-30s    The patient has one daughter who is cancer free.  She has a sister who is cancer free.  Both parents are deceased from non cancer related issues.  The patient's mother had two brothers and a sister.  One brother is deceased from non cancer related illness.  The maternal grandparents are deceased.  The grandfather died of metastatic prostate cancer.  The patients father had several siblings.  One sister did not have cancer but had two daughters who had uterine and ovarian cancer.  One sister had leukemia, and one brother had kidney cancer in his late 54's-30's.  The paternal grandparents were deceased from non cancer related issues.  Maria Gross is unaware of previous family history of genetic testing for hereditary  cancer risks. Patient's maternal ancestors are of [place] descent, and paternal ancestors are of Caucasian descent. There is no reported Ashkenazi Jewish ancestry. There is no known consanguinity.  GENETIC COUNSELING ASSESSMENT: Maria Gross is a 56 y.o. female with a personal history of uterine cancer and the family history of uterine, ovarian and kidney cancer which is somewhat suggestive of a hereditary cancer syndrome and predisposition to cancer. We, therefore, discussed and recommended the following at today's visit.   DISCUSSION: We discussed that about 3-5% of uterine cancer is due to inherited causes, with most cases due to Lynch syndrome.  Another cause of early onset uterine cancer is due to Cowden syndrome, or PTEN mutations.  We reviewed the characteristics, features and inheritance patterns of hereditary cancer syndromes. We also discussed genetic testing, including the appropriate family members to test, the process of testing, insurance coverage and turn-around-time for results. We discussed the implications of a negative, positive and/or variant of uncertain significant result. We recommended Maria Gross pursue genetic testing for the common hereditary cancer gene panel. The Hereditary Gene Panel offered by [facility] includes sequencing and/or deletion duplication testing of the following 46 genes: APC, ATM, AXIN2, BARD1, BMPR1A, BRCA1, BRCA2, BRIP1, CDH1, CDKN2A (p14ARF), CDKN2A (p16INK4a), CHEK2, CTNNA1, DICER1, EPCAM (Deletion/duplication testing only), GREM1 (promoter region deletion/duplication testing only), KIT, MEN1, MLH1, MSH2, MSH3, MSH6, MUTYH, NBN, NF1, NHTL1, PALB2, PDGFRA, PMS2, POLD1, POLE, PTEN, RAD50, RAD51C, RAD51D, SDHB, SDHC, SDHD, SMAD4, SMARCA4. STK11, TP53, TSC1, TSC2, and VHL.  The following genes were evaluated for sequence changes only: SDHA and HOXB13 c.251G>A variant only.   Based on Maria Gross's personal and family history of cancer, she meets medical  criteria for genetic testing. Despite that she meets criteria, she may still have an out of pocket cost. We discussed that if her out of pocket cost for testing is over $100, the laboratory will call and confirm whether she wants to proceed with testing.  If the out of pocket cost of testing is less than $100 she will be billed by the genetic testing laboratory.   PLAN: After considering the risks, benefits, and limitations, Maria Gross  provided informed consent to pursue genetic testing and the blood sample was sent to [facility] for analysis of the Common Hereditary Cancer Panel. Results should be available within approximately 2-3 weeks' time, at which point they will be disclosed by telephone to Maria Gross, as  will any additional recommendations warranted by these results. Maria Gross will receive a summary of her genetic counseling visit and a copy of her results once available. This information will also be available in Epic. We encouraged Maria Gross to remain in contact with cancer genetics annually so that we can continuously update the family history and inform her of any changes in cancer genetics and testing that may be of benefit for her family. Maria Gross questions were answered to her satisfaction today. Our contact information was provided should additional questions or concerns arise.  Lastly, we encouraged Maria Gross to remain in contact with cancer genetics annually so that we can continuously update the family history and inform her of any changes in cancer genetics and testing that may be of benefit for this family.   Ms.  Gross questions were answered to her satisfaction today. Our contact information was provided should additional questions or concerns arise. Thank you for the referral and allowing [place] to share in the care of your patient.   Thaddus Mcdowell P. Florene Glen, [place], [facility] Certified Genetic Counselor [email or web address] phone: [phone]  The patient was seen for a  total of 35 minutes in face-to-face genetic counseling.  This patient was discussed with Drs. Magrinat, Lindi Adie and/or Burr Medico who agrees with the above.    _______________________________________________________________________ For Office Staff:  Number of people involved in session: 1 Was an Intern/ student involved with case: no

## 2017-05-26 ENCOUNTER — Encounter: Payer: Self-pay | Admitting: Genetic Counselor

## 2017-05-26 ENCOUNTER — Telehealth: Payer: Self-pay | Admitting: Genetic Counselor

## 2017-05-26 DIAGNOSIS — Z1379 Encounter for other screening for genetic and chromosomal anomalies: Secondary | ICD-10-CM | POA: Insufficient documentation

## 2017-05-26 NOTE — Telephone Encounter (Signed)
LM on VM with good news.  Asked that she CB and gave CB instructions.

## 2017-06-01 NOTE — Telephone Encounter (Signed)
LM on VM with good news.  Asked that she CB. 

## 2017-06-03 ENCOUNTER — Ambulatory Visit: Payer: Self-pay | Admitting: Genetic Counselor

## 2017-06-03 DIAGNOSIS — Z8049 Family history of malignant neoplasm of other genital organs: Secondary | ICD-10-CM

## 2017-06-03 DIAGNOSIS — Z1379 Encounter for other screening for genetic and chromosomal anomalies: Secondary | ICD-10-CM

## 2017-06-03 DIAGNOSIS — Z8042 Family history of malignant neoplasm of prostate: Secondary | ICD-10-CM

## 2017-06-03 DIAGNOSIS — Z8041 Family history of malignant neoplasm of ovary: Secondary | ICD-10-CM

## 2017-06-03 DIAGNOSIS — Z8051 Family history of malignant neoplasm of kidney: Secondary | ICD-10-CM

## 2017-06-03 NOTE — Telephone Encounter (Signed)
Revealed negative genetic testing.  Discussed that we do not know why she has breast cancer or why there is cancer in the family. It could be due to a different gene that we are not testing, or maybe our current technology may not be able to pick something up.  It will be important for her to keep in contact with genetics to keep up with whether additional testing may be needed.  Discussed POLE VUS and that we will recontact her once this is reclassified.

## 2017-06-03 NOTE — Progress Notes (Signed)
HPI: Ms. Grieves was previously seen in the [place] clinic due to a personal and family history of cancer and concerns regarding a hereditary predisposition to cancer. Please refer to our prior cancer genetics clinic note for more information regarding Ms. Lefeber's medical, social and family histories, and our assessment and recommendations, at the time. Ms. Brash recent genetic test results were disclosed to her, as were recommendations warranted by these results. These results and recommendations are discussed in more detail below.  CANCER HISTORY:    Uterine cancer [facility])    Initial Diagnosis    Uterine cancer ([place])     05/26/2017 Genetic Testing    POLE c.3245G>A (p.Arg1082His) VUS identified on the multi-gene panel.  The Multi-Gene Panel offered by [facility] includes sequencing and/or deletion duplication testing of the following 80 genes: ALK, APC, ATM, AXIN2,BAP1,  BARD1, BLM, BMPR1A, BRCA1, BRCA2, BRIP1, CASR, CDC73, CDH1, CDK4, CDKN1B, CDKN1C, CDKN2A (p14ARF), CDKN2A (p16INK4a), CEBPA, CHEK2, CTNNA1, DICER1, DIS3L2, EGFR (c.2369C>T, p.Thr790Met variant only), EPCAM (Deletion/duplication testing only), FH, FLCN, GATA2, GPC3, GREM1 (Promoter region deletion/duplication testing only), HOXB13 (c.251G>A, p.Gly84Glu), HRAS, KIT, MAX, MEN1, MET, MITF (c.952G>A, p.Glu318Lys variant only), MLH1, MSH2, MSH3, MSH6, MUTYH, NBN, NF1, NF2, NTHL1, PALB2, PDGFRA, PHOX2B, PMS2, POLD1, POLE, POT1, PRKAR1A, PTCH1, PTEN, RAD50, RAD51C, RAD51D, RB1, RECQL4, RET, RUNX1, SDHAF2, SDHA (sequence changes only), SDHB, SDHC, SDHD, SMAD4, SMARCA4, SMARCB1, SMARCE1, STK11, SUFU, TERT, TERT, TMEM127, TP53, TSC1, TSC2, VHL, WRN and WT1.  The report date is May 24, 2017.        FAMILY HISTORY:  We obtained a detailed, 4-generation family history.  Significant diagnoses are listed below: Family History  Problem Relation Age of Onset  . Diabetes Mother 22  . Other Mother        passed away from a  blood clot after sinus surgery  . Migraines Mother   . COPD Father 73  . Diabetes Father   . Diabetes Sister   . Cancer Maternal Grandfather 50       dec metastatic prostate ca  . Leukemia Paternal Aunt   . Cancer Cousin        uterine and ovarian  . Cancer Cousin        uterine and ovarian  . Kidney cancer Paternal Uncle        dx in his late 57s-30s    The patient has one daughter who is cancer free.  She has a sister who is cancer free.  Both parents are deceased from non cancer related issues.  The patient's mother had two brothers and a sister.  One brother is deceased from non cancer related illness.  The maternal grandparents are deceased.  The grandfather died of metastatic prostate cancer.  The patients father had several siblings.  One sister did not have cancer but had two daughters who had uterine and ovarian cancer.  One sister had leukemia, and one brother had kidney cancer in his late 47's-30's.  The paternal grandparents were deceased from non cancer related issues.  Ms. Sisler is unaware of previous family history of genetic testing for hereditary cancer risks. Patient's maternal ancestors are of [place] and [place] descent, and paternal ancestors are of Caucasian descent. There is no reported Ashkenazi Jewish ancestry. There is no known consanguinity.  GENETIC TEST RESULTS: Genetic testing reported out on June 03, 2017 through the Multi-cancer panel found no deleterious mutations.  The Multi-Gene Panel offered by [facility] includes sequencing and/or deletion duplication testing of the following 80 genes:  ALK, APC, ATM, AXIN2,BAP1,  BARD1, BLM, BMPR1A, BRCA1, BRCA2, BRIP1, CASR, CDC73, CDH1, CDK4, CDKN1B, CDKN1C, CDKN2A (p14ARF), CDKN2A (p16INK4a), CEBPA, CHEK2, CTNNA1, DICER1, DIS3L2, EGFR (c.2369C>T, p.Thr790Met variant only), EPCAM (Deletion/duplication testing only), FH, FLCN, GATA2, GPC3, GREM1 (Promoter region deletion/duplication testing only), HOXB13 (c.251G>A,  p.Gly84Glu), HRAS, KIT, MAX, MEN1, MET, MITF (c.952G>A, p.Glu318Lys variant only), MLH1, MSH2, MSH3, MSH6, MUTYH, NBN, NF1, NF2, NTHL1, PALB2, PDGFRA, PHOX2B, PMS2, POLD1, POLE, POT1, PRKAR1A, PTCH1, PTEN, RAD50, RAD51C, RAD51D, RB1, RECQL4, RET, RUNX1, SDHAF2, SDHA (sequence changes only), SDHB, SDHC, SDHD, SMAD4, SMARCA4, SMARCB1, SMARCE1, STK11, SUFU, TERT, TERT, TMEM127, TP53, TSC1, TSC2, VHL, WRN and WT1.  The test report has been scanned into EPIC and is located under the Molecular Pathology section of the Results Review tab.   We discussed with Ms. Sroka that since the current genetic testing is not perfect, it is possible there may be a gene mutation in one of these genes that current testing cannot detect, but that chance is small. We also discussed, that it is possible that another gene that has not yet been discovered, or that we have not yet tested, is responsible for the cancer diagnoses in the family, and it is, therefore, important to remain in touch with cancer genetics in the future so that we can continue to offer Ms. Pomales the most up to date genetic testing.   Genetic testing did detect a Variant of Unknown Significance in the POLE gene called c.3245G>A (Arg1082His).  At this time, it is unknown if this variant is associated with increased cancer risk or if this is a normal finding, but most variants such as this get reclassified to being inconsequential. It should not be used to make medical management decisions. With time, we suspect the lab will determine the significance of this variant, if any. If we do learn more about it, we will try to contact Ms. Vitug to discuss it further. However, it is important to stay in touch with [place] periodically and keep the address and phone number up to date.    CANCER SCREENING RECOMMENDATIONS: This result is reassuring and indicates that Ms. Corporan likely does not have an increased risk for a future cancer due to a mutation in one of these genes.  This normal test also suggests that Ms. Minney's cancer was most likely not due to an inherited predisposition associated with one of these genes.  Most cancers happen by chance and this negative test suggests that her cancer falls into this category.  We, therefore, recommended she continue to follow the cancer management and screening guidelines provided by her oncology and primary healthcare provider.   RECOMMENDATIONS FOR FAMILY MEMBERS: Women in this family might be at some increased risk of developing cancer, over the general population risk, simply due to the family history of cancer. We recommended women in this family have a yearly mammogram beginning at age 68, or 8 years younger than the earliest onset of cancer, an annual clinical breast exam, and perform monthly breast self-exams. Women in this family should also have a gynecological exam as recommended by their primary provider. All family members should have a colonoscopy by age 13.  FOLLOW-UP: Lastly, we discussed with Ms. Mcmannis that cancer genetics is a rapidly advancing field and it is possible that new genetic tests will be appropriate for her and/or her family members in the future. We encouraged her to remain in contact with cancer genetics on an annual basis so we can update her personal and family  histories and let her know of advances in cancer genetics that may benefit this family.   Our contact number was provided. Ms. Chasse questions were answered to her satisfaction, and she knows she is welcome to call us at anytime with additional questions or concerns.   Roma Kayser, MS, [facility] Certified Genetic Counselor [email or web address]

## 2017-09-18 DIAGNOSIS — M791 Myalgia, unspecified site: Secondary | ICD-10-CM | POA: Diagnosis not present

## 2017-09-18 DIAGNOSIS — R51 Headache: Secondary | ICD-10-CM | POA: Diagnosis not present

## 2017-09-18 DIAGNOSIS — M9902 Segmental and somatic dysfunction of thoracic region: Secondary | ICD-10-CM | POA: Diagnosis not present

## 2017-09-18 DIAGNOSIS — M9901 Segmental and somatic dysfunction of cervical region: Secondary | ICD-10-CM | POA: Diagnosis not present

## 2017-09-24 DIAGNOSIS — R300 Dysuria: Secondary | ICD-10-CM | POA: Diagnosis not present

## 2017-09-24 DIAGNOSIS — M549 Dorsalgia, unspecified: Secondary | ICD-10-CM | POA: Diagnosis not present

## 2017-10-09 DIAGNOSIS — M791 Myalgia, unspecified site: Secondary | ICD-10-CM | POA: Diagnosis not present

## 2017-10-09 DIAGNOSIS — R51 Headache: Secondary | ICD-10-CM | POA: Diagnosis not present

## 2017-10-09 DIAGNOSIS — M9901 Segmental and somatic dysfunction of cervical region: Secondary | ICD-10-CM | POA: Diagnosis not present

## 2017-10-09 DIAGNOSIS — M9902 Segmental and somatic dysfunction of thoracic region: Secondary | ICD-10-CM | POA: Diagnosis not present

## 2017-12-03 ENCOUNTER — Other Ambulatory Visit: Payer: Self-pay | Admitting: Obstetrics and Gynecology

## 2017-12-03 DIAGNOSIS — Z1231 Encounter for screening mammogram for malignant neoplasm of breast: Secondary | ICD-10-CM

## 2017-12-28 ENCOUNTER — Ambulatory Visit: Payer: BLUE CROSS/BLUE SHIELD

## 2018-01-13 ENCOUNTER — Ambulatory Visit
Admission: RE | Admit: 2018-01-13 | Discharge: 2018-01-13 | Disposition: A | Discharge status: Home / Self Care | Payer: BLUE CROSS/BLUE SHIELD | Source: Ambulatory Visit | Attending: Obstetrics and Gynecology | Admitting: Obstetrics and Gynecology

## 2018-01-13 DIAGNOSIS — Z1231 Encounter for screening mammogram for malignant neoplasm of breast: Secondary | ICD-10-CM | POA: Diagnosis not present

## 2018-01-24 ENCOUNTER — Ambulatory Visit (INDEPENDENT_AMBULATORY_CARE_PROVIDER_SITE_OTHER): Payer: BLUE CROSS/BLUE SHIELD | Admitting: Obstetrics and Gynecology

## 2018-01-24 ENCOUNTER — Encounter: Payer: Self-pay | Admitting: Obstetrics and Gynecology

## 2018-01-24 ENCOUNTER — Telehealth: Payer: Self-pay | Admitting: Obstetrics and Gynecology

## 2018-01-24 ENCOUNTER — Other Ambulatory Visit: Payer: Self-pay

## 2018-01-24 VITALS — BP 126/86 | HR 80 | Resp 14 | Ht 62.0 in | Wt 176.0 lb

## 2018-01-24 DIAGNOSIS — Z7689 Persons encountering health services in other specified circumstances: Secondary | ICD-10-CM

## 2018-01-24 DIAGNOSIS — R39198 Other difficulties with micturition: Secondary | ICD-10-CM

## 2018-01-24 DIAGNOSIS — R739 Hyperglycemia, unspecified: Secondary | ICD-10-CM

## 2018-01-24 DIAGNOSIS — N816 Rectocele: Secondary | ICD-10-CM

## 2018-01-24 DIAGNOSIS — R5383 Other fatigue: Secondary | ICD-10-CM

## 2018-01-24 LAB — POCT URINALYSIS DIPSTICK
Bilirubin, UA: NEGATIVE
Blood, UA: NEGATIVE
Glucose, UA: NEGATIVE
Ketones, UA: NEGATIVE
Leukocytes, UA: NEGATIVE
Nitrite, UA: NEGATIVE
Protein, UA: NEGATIVE
Urobilinogen, UA: 0.2 E.U./dL
pH, UA: 5 (ref 5.0–8.0)

## 2018-01-24 NOTE — Progress Notes (Signed)
testGYNECOLOGY  VISIT   HPI: 57 y.o.   Married  Caucasian  female   G1P1001 with No LMP recorded. Patient has had a hysterectomy.   here to discuss HRT and not feeling well. Had similar symptoms to below last year and saw her PCP.   States she is voiding often, about once per hour for the last 2 weeks.  Has urgency and then she only trickles.  Hx urethral surgery years ago.  Feels thirsty all the time.  She is concerned about diabetes insipidus.   A cup coffee in the am, maybe 2 cups during the weekend.  Occasional Coke.  Rare ETOH.   Feels bad.  Tired all the time more than normal.  Washed out.  Not sleeping well.  Wakes up in the middle of the night.  Taking Xanax to help sleep, 0.5 mg.  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   Hormones help to calm her.  Hot flashes are well controlled.  She would like a referral to a [facility] provider for a PCP.  Wants her health information to be in the [facility] system.  She would also like to see a urologist in [place].  At the end of the consultation she offers that her blood sugar is elevated at 200.  She has declined medication through her PCP.  GYNECOLOGIC HISTORY: No LMP recorded. Patient has had a hysterectomy. Contraception:  Hysterectomy  Menopausal hormone therapy:  vivelle-dot Last mammogram:  01-13-18 density b/BIRADS 1 negative  Last pap smear:   03-19-17 negative, HR HPV negative        OB History    Gravida  1   Para  1   Term  1   Preterm      AB      Living  1     SAB      TAB      Ectopic      Multiple      Live Births                 Patient Active Problem List   Diagnosis Date Noted  . Genetic testing 05/26/2017  . Family history of uterine cancer   . Family history of ovarian cancer   . Family history of kidney cancer   . Family history of prostate cancer   . Uterine cancer [facility]     Past Medical History:  Diagnosis Date  . Abnormal Pap smear of cervix    --age 51 had  precancerous changes on pap and had hysterectomy  . Alopecia   . Anemia   . Anxiety   . Diverticulitis   . Endometriosis   . Family history of kidney cancer   . Family history of ovarian cancer   . Family history of prostate cancer   . Family history of uterine cancer   . High cholesterol   . Migraines   . Uterine cancer ([place])    dx at 34    Past Surgical History:  Procedure Laterality Date  . ABDOMINAL HYSTERECTOMY     L-TAH--ovaries remain  . left kidney blockage Left    age 59 and age 65  . TONSILLECTOMY AND ADENOIDECTOMY     -age 60  . urethral stent Left    age 65    Current Outpatient Medications  Medication Sig Dispense Refill  . ALPRAZolam (XANAX) 1 MG tablet Take 0.5 mg by mouth at bedtime as needed for anxiety.    Marland Kitchen atorvastatin (  LIPITOR) 40 MG tablet Take 40 mg by mouth at bedtime.    . citalopram (CELEXA) 40 MG tablet Take 40 mg by mouth at bedtime.    . Cyanocobalamin (VITAMIN B-12 PO) Take 1 tablet by mouth daily.    Marland Kitchen estradiol (VIVELLE-DOT) 0.0375 MG/24HR PLACE 1 PATCH ANYWHERE ON LOWER ABDOMEN AND CHANGE TWICE WEEKLY 24 patch 3  . loratadine (CLARITIN) 10 MG tablet Take 10 mg by mouth daily.    Marland Kitchen MELATONIN PO Take 1 tablet by mouth at bedtime.    . Multiple Vitamin (MULTIVITAMIN) capsule Take 1 capsule by mouth daily.    . polycarbophil (FIBERCON) 625 MG tablet Take 625 mg by mouth daily.    Vladimir Faster Glycol-Propyl Glycol (SYSTANE) 0.4-0.3 % SOLN Place 1 drop into both eyes 2 (two) times daily.    . SUMAtriptan (IMITREX) 50 MG tablet Take 1 tablet by mouth as needed.  3   No current facility-administered medications for this visit.      ALLERGIES: Ivp dye [iodinated diagnostic agents]  Family History  Problem Relation Age of Onset  . Diabetes Mother 24  . Other Mother        passed away from a blood clot after sinus surgery  . Migraines Mother   . COPD Father 30  . Diabetes Father   . Diabetes Sister   . Cancer Maternal Grandfather 59        dec metastatic prostate ca  . Leukemia Paternal Aunt   . Cancer Cousin        uterine and ovarian  . Cancer Cousin        uterine and ovarian  . Kidney cancer Paternal Uncle        dx in his late 6s-30s    Social History   Socioeconomic History  . Marital status: Married    Spouse name: Not on file  . Number of children: Not on file  . Years of education: Not on file  . Highest education level: Not on file  Occupational History  . Not on file  Social Needs  . Financial resource strain: Not on file  . Food insecurity:    Worry: Not on file    Inability: Not on file  . Transportation needs:    Medical: Not on file    Non-medical: Not on file  Tobacco Use  . Smoking status: Never Smoker  . Smokeless tobacco: Never Used  Substance and Sexual Activity  . Alcohol use: Yes    Alcohol/week: 0.6 oz    Types: 1 Glasses of wine per week  . Drug use: No  . Sexual activity: Yes    Partners: Male    Comment: Hyst  Lifestyle  . Physical activity:    Days per week: Not on file    Minutes per session: Not on file  . Stress: Not on file  Relationships  . Social connections:    Talks on phone: Not on file    Gets together: Not on file    Attends religious service: Not on file    Active member of club or organization: Not on file    Attends meetings of clubs or organizations: Not on file    Relationship status: Not on file  . Intimate partner violence:    Fear of current or ex partner: Not on file    Emotionally abused: Not on file    Physically abused: Not on file    Forced sexual activity: Not on file  Other  Topics Concern  . Not on file  Social History Narrative  . Not on file    Review of Systems  Constitutional: Positive for fatigue.  HENT: Negative.   Eyes: Negative.   Cardiovascular: Negative.   Gastrointestinal: Negative.   Endocrine: Positive for polydipsia.  Genitourinary: Positive for urgency.  Musculoskeletal: Negative.   Skin: Negative.        Hair  loss  Allergic/Immunologic: Negative.   Neurological: Negative.   Hematological: Negative.   Psychiatric/Behavioral: Negative.     PHYSICAL EXAMINATION:    BP 126/86 (BP Location: Right Arm, Patient Position: Sitting, Cuff Size: Normal)   Pulse 80   Resp 14   Ht 5\' 2"  (1.575 m)   Wt 176 lb (79.8 kg)   BMI 32.19 kg/m     General appearance: alert, cooperative and appears stated age Head: Normocephalic, without obvious abnormality, atraumatic  Lungs: clear to auscultation bilaterally Heart: regular rate and rhythm Abdomen: soft, non-tender, no masses,  no organomegaly Extremities: extremities normal, atraumatic, no cyanosis or edema Skin: Skin color, texture, turgor normal. No rashes or lesions No abnormal inguinal nodes palpated Neurologic: Grossly normal  Pelvic: External genitalia:  no lesions              Urethra:  normal appearing urethra with no masses, tenderness or lesions              Bartholins and Skenes: normal                 Vagina: normal appearing vagina with normal color and discharge, no lesions.  Second degree rectocele.              Cervix:  absent.                 Bimanual Exam:  Uterus:  absent              Adnexa: no mass, fullness, tenderness              Rectal exam: Yes.  .  Confirms.              Anus:  normal sphincter tone, no lesions  Sterile cath for 465 cc.   Chaperone was present for exam.  ASSESSMENT Status post hysterectomy for cervical dysplasia age 34.  Ovaries remain.  ERT.  Alopecia. Paternal family with cancer history.    Polydipsia.  Fatigue.  Elevated blood sugar.  FH DM.  Possible overactive bladder.  Rectocele - second degree.   PLAN  Referral to PCP for establishing care in [facility] system.  Referral to urology regarding voiding dysfunction.  Labs - CMP, hemoglobin A1C, TSH, CBC.  Urine dip.  Discussed rectocele.  Written information also given.    An After Visit Summary was printed and given to the  patient.  __40____ minutes face to face time of which over 50% was spent in counseling.

## 2018-01-24 NOTE — Telephone Encounter (Signed)
Patient is being referred to [facility] and would like to see Dr Jeffie Pollock or Dr Karsten Ro.

## 2018-01-25 ENCOUNTER — Other Ambulatory Visit: Payer: Self-pay | Admitting: *Deleted

## 2018-01-25 DIAGNOSIS — R39198 Other difficulties with micturition: Secondary | ICD-10-CM

## 2018-01-25 LAB — COMPREHENSIVE METABOLIC PANEL
ALT: 34 IU/L — ABNORMAL HIGH (ref 0–32)
AST: 23 IU/L (ref 0–40)
Albumin/Globulin Ratio: 1.7 (ref 1.2–2.2)
Albumin: 4.4 g/dL (ref 3.5–5.5)
Alkaline Phosphatase: 66 IU/L (ref 39–117)
BUN/Creatinine Ratio: 19 (ref 9–23)
BUN: 15 mg/dL (ref 6–24)
Bilirubin Total: 0.7 mg/dL (ref 0.0–1.2)
CO2: 26 mmol/L (ref 20–29)
Calcium: 9.3 mg/dL (ref 8.7–10.2)
Chloride: 101 mmol/L (ref 96–106)
Creatinine, Ser: 0.78 mg/dL (ref 0.57–1.00)
GFR calc Af Amer: 98 mL/min/{1.73_m2} (ref >59)
GFR calc non Af Amer: 85 mL/min/{1.73_m2} (ref >59)
Globulin, Total: 2.6 g/dL (ref 1.5–4.5)
Glucose: 87 mg/dL (ref 65–99)
Potassium: 4 mmol/L (ref 3.5–5.2)
Sodium: 143 mmol/L (ref 134–144)
Total Protein: 7 g/dL (ref 6.0–8.5)

## 2018-01-25 LAB — CBC
Hematocrit: 40.5 % (ref 34.0–46.6)
Hemoglobin: 13.5 g/dL (ref 11.1–15.9)
MCH: 30.6 pg (ref 26.6–33.0)
MCHC: 33.3 g/dL (ref 31.5–35.7)
MCV: 92 fL (ref 79–97)
Platelets: 229 10*3/uL (ref 150–450)
RBC: 4.41 x10E6/uL (ref 3.77–5.28)
RDW: 13.6 % (ref 12.3–15.4)
WBC: 8.5 10*3/uL (ref 3.4–10.8)

## 2018-01-25 LAB — HEMOGLOBIN A1C
Est. average glucose Bld gHb Est-mCnc: 123 mg/dL
Hgb A1c MFr Bld: 5.9 % — ABNORMAL HIGH (ref 4.8–5.6)

## 2018-01-25 LAB — TSH: TSH: 2.71 u[IU]/mL (ref 0.450–4.500)

## 2018-01-25 NOTE — Telephone Encounter (Signed)
Please put in a referral for this patient.

## 2018-01-25 NOTE — Telephone Encounter (Signed)
Reviewed OV notes dated 01/24/18. Ok for referral to urology. Order placed.   Routing to provider for final review.  Will close encounter.  Cc: Magdalene Patricia

## 2018-02-01 ENCOUNTER — Telehealth: Payer: Self-pay | Admitting: *Deleted

## 2018-02-01 NOTE — Telephone Encounter (Signed)
-----   Message from Nunzio Cobbs, MD sent at 01/25/2018  1:24 PM EDT ----- Please report results to patient.  Her blood sugar testing shows that she is prediabetic.  Her hemoglobin A1C level is 5.9, which means her average blood sugar level is about 123.  Normal is usually under 100.  She needs to work on a low sugar and a low carbohydrate diet and do vigorous exercise to lower this and reduce her risk of diabetes.   Her liver enzyme called ALT was 34.  This is mildly elevated.  Her metabolic profile was otherwise normal.  Her TSH and CBC were normal.   At her visit yesterday, she requested a referral to a PCP in the [facility] system.  I would like for her to have an appointment with her new PCP to follow her blood sugar and her liver function studies.   Thank you for scheduling this appointment.

## 2018-02-01 NOTE — Telephone Encounter (Signed)
Spoke with patient, advised as seen below per Dr. Quincy Simmonds. Advised patient referral to PCP has been placed and she will be contacted by our office referral coordinator for scheduling. Patient verbalizes understanding and is agreeable.   Referral order for PCP updated to include elevated ALT and HgbA1c.   Encounter closed.

## 2018-02-01 NOTE — Telephone Encounter (Signed)
Notes recorded by Burnice Logan, RN on 02/01/2018 at 8:26 AM EDT Left message to call Sharee Pimple at [phone].

## 2018-02-01 NOTE — Telephone Encounter (Signed)
Patient is returning a call to Jill. °

## 2018-02-07 ENCOUNTER — Telehealth: Payer: Self-pay | Admitting: Obstetrics and Gynecology

## 2018-02-07 ENCOUNTER — Telehealth: Payer: Self-pay

## 2018-02-07 NOTE — Telephone Encounter (Signed)
Patient called [facility] on 02/05/18 to schedule appointment with Dr. Johnnette Litter. Called patient to day to get appointment scheduled left message for return call.

## 2018-02-07 NOTE — Telephone Encounter (Signed)
Routing to [employer] , referral coordinator, for assistance with scheduling.

## 2018-02-07 NOTE — Telephone Encounter (Signed)
Patient states she was referred to Dr. Edilia Bo. When she called to make the appointment, she learned Dr. Edilia Bo only sees patients in [facility]. Patient does not want to travel to [facility] to be seen. Would like to know if there is another doctor in [place] that she could be referred to.

## 2018-02-08 ENCOUNTER — Other Ambulatory Visit: Payer: Self-pay | Admitting: *Deleted

## 2018-02-08 DIAGNOSIS — Z7689 Persons encountering health services in other specified circumstances: Secondary | ICD-10-CM

## 2018-02-08 DIAGNOSIS — R7989 Other specified abnormal findings of blood chemistry: Secondary | ICD-10-CM

## 2018-02-08 DIAGNOSIS — R945 Abnormal results of liver function studies: Secondary | ICD-10-CM

## 2018-02-08 DIAGNOSIS — R7303 Prediabetes: Secondary | ICD-10-CM

## 2018-02-24 ENCOUNTER — Telehealth: Payer: Self-pay | Admitting: Obstetrics and Gynecology

## 2018-02-24 NOTE — Telephone Encounter (Signed)
Call placed in reference to a referral. °

## 2018-03-18 ENCOUNTER — Ambulatory Visit: Payer: BLUE CROSS/BLUE SHIELD | Admitting: Family Medicine

## 2018-03-25 ENCOUNTER — Ambulatory Visit: Payer: BLUE CROSS/BLUE SHIELD | Admitting: Family Medicine

## 2018-04-07 ENCOUNTER — Ambulatory Visit: Payer: BLUE CROSS/BLUE SHIELD | Admitting: Obstetrics and Gynecology

## 2018-04-14 NOTE — Progress Notes (Signed)
Maria Gross DOB: 19-Apr-1961 Encounter date: 04/15/2018  This is a 57 y.o. female who presents to establish care. Chief Complaint  Patient presents with  . New Patient (Initial Visit)    patient would like to discuss meds for BG, does not check regularly, when she does check abv is 198, not fasting had 1 yogurt at 12:00    History of present illness:  Last visit with previous provider they wanted to put her on medicine to help with blood sugar because it was elevated at 108. Has had a hard time bringing this number down with diet and exercise. Has lost some weight. Hoping to not be on medication. Diet at home has completely changed to more vegan and much healthier; low sodium due to husbands health issues. Cooking meals at home rather than picking up foods. Plans to keep stepping up exercise program.   Follows with Dr. Quincy Simmonds for gynecology needs. Doing well with patch.   Had blood in stool a couple of years ago. Was due for colonoscopy at that time. Dx with hemorrhoids so takes fiber, drinks a lot of water and usually does well with this.   Migaine:not monthly; tends to get them every other month. If sick on stomach she is out for day. If takes medicine on time does much better.   Anxiety:Takes citalopram 40mg  at bedtime. Takes the alprazolam at night and it helps her calm down to rest. 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 On call 24-7. Can't take vacation because no one to cover her. Tends to hold things in. Has been out of xanax so has doubled up on melatonin but not getting same rest as she does with the xanax. Has been taking fourths of tablet which still helps her. Has tried other medications for sleep over the years; not sure of names. Did try trazodone and thinks it worked for [employer]. Does get some help with benadryl as well.  Seasonal allergies:  Insomnia:see above.  Hyperlipidemia: Taking statin regularly. No problems with medication.   Family  hx of uterine cancer: following with obgyn for this.    Past Medical History:  Diagnosis Date  . Abnormal Pap smear of cervix    --age 39 had precancerous changes on pap and had hysterectomy  . Alopecia   . Anemia   . Anxiety   . Diverticulitis   . Endometriosis   . Family history of kidney cancer   . Family history of ovarian cancer   . Family history of prostate cancer   . Family history of uterine cancer   . High cholesterol   . Migraines   . Renal stones   . Uterine cancer ([place])    dx at 55   Past Surgical History:  Procedure Laterality Date  . ABDOMINAL HYSTERECTOMY     L-TAH--ovaries remain  . left kidney blockage Left    age [age], and age 45  . TONSILLECTOMY AND ADENOIDECTOMY     -age 89  . urethral stent Left    age 63   Allergies  Allergen Reactions  . Ivp Dye [Iodinated Diagnostic Agents] Other (See Comments)    seizures   Current Meds  Medication Sig  . atorvastatin (LIPITOR) 40 MG tablet Take 1 tablet (40 mg total) by mouth at bedtime.  . citalopram (CELEXA) 40 MG tablet Take 1 tablet (40 mg total) by mouth at bedtime.  . Cyanocobalamin (VITAMIN B-12 PO) Take 1 tablet by mouth daily.  Marland Kitchen  estradiol (VIVELLE-DOT) 0.0375 MG/24HR PLACE 1 PATCH ANYWHERE ON LOWER ABDOMEN AND CHANGE TWICE WEEKLY  . loratadine (CLARITIN) 10 MG tablet Take 10 mg by mouth daily.  Marland Kitchen MELATONIN PO Take 1 tablet by mouth at bedtime.  . Multiple Vitamin (MULTIVITAMIN) capsule Take 1 capsule by mouth daily.  . polycarbophil (FIBERCON) 625 MG tablet Take 625 mg by mouth daily.  Vladimir Faster Glycol-Propyl Glycol (SYSTANE) 0.4-0.3 % SOLN Place 1 drop into both eyes 2 (two) times daily.  . SUMAtriptan (IMITREX) 50 MG tablet Take 1 tablet (50 mg total) by mouth as needed.  . [DISCONTINUED] ALPRAZolam (XANAX) 1 MG tablet Take 0.5 mg by mouth at bedtime as needed for anxiety.  . [DISCONTINUED] atorvastatin (LIPITOR) 40 MG tablet Take 40 mg by mouth at bedtime.  . [DISCONTINUED] citalopram  (CELEXA) 40 MG tablet Take 40 mg by mouth at bedtime.  . [DISCONTINUED] SUMAtriptan (IMITREX) 50 MG tablet Take 1 tablet by mouth as needed.   Social History   Tobacco Use  . Smoking status: Never Smoker  . Smokeless tobacco: Never Used  Substance Use Topics  . Alcohol use: Yes    Alcohol/week: 1.0 standard drinks    Types: 1 Glasses of wine per week   Family History  Problem Relation Age of Onset  . Diabetes Mother 61  . Other Mother        passed away from a blood clot after sinus surgery  . Migraines Mother   . COPD Father 81  . Diabetes Father   . Diabetes Sister   . Cancer Maternal Grandfather 58       dec metastatic prostate ca  . Leukemia Paternal Aunt   . Cancer Cousin        uterine and ovarian  . Cancer Cousin        uterine and ovarian  . Kidney cancer Paternal Uncle        dx in his late 75s-30s     Review of Systems  Constitutional: Negative for chills, fatigue and fever.  Respiratory: Negative for cough, chest tightness, shortness of breath and wheezing.   Cardiovascular: Negative for chest pain, palpitations and leg swelling.  Psychiatric/Behavioral: Positive for sleep disturbance. The patient is nervous/anxious.     Objective:  BP 120/60 (BP Location: Left Arm, Patient Position: Sitting, Cuff Size: Normal)   Pulse 92   Temp 98.2 F (36.8 C) (Oral)   Ht 5\' 2"  (1.575 m)   Wt 171 lb 6.4 oz (77.7 kg)   SpO2 99%   BMI 31.35 kg/m   Weight: 171 lb 6.4 oz (77.7 kg)   BP Readings from Last 3 Encounters:  04/15/18 120/60  01/24/18 126/86  03/19/17 140/72   Wt Readings from Last 3 Encounters:  04/15/18 171 lb 6.4 oz (77.7 kg)  01/24/18 176 lb (79.8 kg)  03/19/17 172 lb 12.8 oz (78.4 kg)    Physical Exam  Constitutional: She appears well-developed and well-nourished. No distress.  Neck: No thyromegaly present.  Cardiovascular: Normal rate, regular rhythm and normal heart sounds. Exam reveals no friction rub.  No murmur heard. No lower extremity  edema  Pulmonary/Chest: Effort normal and breath sounds normal. No respiratory distress. She has no wheezes. She has no rales.  Psychiatric: She has a normal mood and affect. Her behavior is normal. Thought content normal.    Assessment/Plan: 1. Insomnia, unspecified type Willing to try trazodone instead of alprazolam. I have asked her to keep me posted on how she does with this.  She is also going to try Benadryl in combination with the melatonin.  I also suspect she adds in regular exercise that this will help with her sleep. - traZODone (DESYREL) 50 MG tablet; Take 0.5-1 tablets (25-50 mg total) by mouth at bedtime as needed for sleep.  Dispense: 30 tablet; Refill: 3  2. Impaired fasting glucose She has already made significant dietary changes at home and is losing weight.  Keep up with this.  We will continue to monitor A1c; recommend a recheck in 6 months time. - Hemoglobin A1c; Future  3. Other migraine without status migrainosus, not intractable Take medication at onset of symptoms. - SUMAtriptan (IMITREX) 50 MG tablet; Take 1 tablet (50 mg total) by mouth as needed.  Dispense: 10 tablet; Refill: 3  4. Hyperlipidemia, unspecified hyperlipidemia type  - Lipid panel; Future  5. Need for influenza vaccination  - Flu Vaccine QUAD 36+ mos IM   Return in about 2 months (around 06/15/2018) for physical exam.  Micheline Rough, MD

## 2018-04-15 ENCOUNTER — Ambulatory Visit: Payer: BLUE CROSS/BLUE SHIELD | Admitting: Family Medicine

## 2018-04-15 ENCOUNTER — Encounter: Payer: Self-pay | Admitting: Family Medicine

## 2018-04-15 VITALS — BP 120/60 | HR 92 | Temp 98.2°F | Ht 62.0 in | Wt 171.4 lb

## 2018-04-15 DIAGNOSIS — E785 Hyperlipidemia, unspecified: Secondary | ICD-10-CM | POA: Diagnosis not present

## 2018-04-15 DIAGNOSIS — Z23 Encounter for immunization: Secondary | ICD-10-CM

## 2018-04-15 DIAGNOSIS — G43809 Other migraine, not intractable, without status migrainosus: Secondary | ICD-10-CM

## 2018-04-15 DIAGNOSIS — R7301 Impaired fasting glucose: Secondary | ICD-10-CM | POA: Diagnosis not present

## 2018-04-15 DIAGNOSIS — G4700 Insomnia, unspecified: Secondary | ICD-10-CM

## 2018-04-15 MED ORDER — CITALOPRAM HYDROBROMIDE 40 MG PO TABS: 40.0000 mg | ORAL_TABLET | Freq: Every day | ORAL | 1 refills | Status: DC

## 2018-04-15 MED ORDER — SUMATRIPTAN SUCCINATE 50 MG PO TABS: 50.0000 mg | ORAL_TABLET | ORAL | 3 refills | Status: DC | PRN

## 2018-04-15 MED ORDER — TRAZODONE HCL 50 MG PO TABS: 25.0000 mg | ORAL_TABLET | Freq: Every evening | ORAL | 3 refills | Status: DC | PRN

## 2018-04-15 MED ORDER — ATORVASTATIN CALCIUM 40 MG PO TABS: 40.0000 mg | ORAL_TABLET | Freq: Every day | ORAL | 1 refills | Status: DC

## 2018-04-15 NOTE — Patient Instructions (Signed)
Why is Exercise Important? If I told you I had a single pill that would help you decrease stress by improving anxiety, decreasing depression, help you achieve a healthy weight, give you more energy, make you more productive, help you focus, decrease your risk of dementia/heart attack/stroke/falls, improve your bone health, and more would you be interested? These are just some of the benefits that exercise brings to you. IT IS WORTH carving out some time every day to fit in exercise. It will help in every aspect of your health. Even if you have injuries that prevent you from participating in a type of exercise you used to do; there is always something that you can do to keep exercise a part of your life. If improving your health is important, make exercise your priority. It is worth the time! If you have questions about the type of exercise that is right for you, please talk with me about this!     Exercising to Stay Healthy  Exercising regularly is important. It has many health benefits, such as:  Improving your overall fitness, flexibility, and endurance.  Increasing your bone density.  Helping with weight control.  Decreasing your body fat.  Increasing your muscle strength.  Reducing stress and tension.  Improving your overall health.   In order to become healthy and stay healthy, it is recommended that you do moderate-intensity and vigorous-intensity exercise. You can tell that you are exercising at a moderate intensity if you have a higher heart rate and faster breathing, but you are still able to hold a conversation. You can tell that you are exercising at a vigorous intensity if you are breathing much harder and faster and cannot hold a conversation while exercising. How often should I exercise? Choose an activity that you enjoy and set realistic goals. Your health care provider can help you to make an activity plan that works for you. Exercise regularly as directed by your health care  provider. This may include:  Doing resistance training twice each week, such as: ? Push-ups. ? Sit-ups. ? Lifting weights. ? Using resistance bands.  Doing a given intensity of exercise for a given amount of time. Choose from these options: ? 150 minutes of moderate-intensity exercise every week. ? 75 minutes of vigorous-intensity exercise every week. ? A mix of moderate-intensity and vigorous-intensity exercise every week.   Children, pregnant women, people who are out of shape, people who are overweight, and older adults may need to consult a health care provider for individual recommendations. If you have any sort of medical condition, be sure to consult your health care provider before starting a new exercise program. What are some exercise ideas? Some moderate-intensity exercise ideas include:  Walking at a rate of 1 mile in 15 minutes.  Biking.  Hiking.  Golfing.  Dancing.   Some vigorous-intensity exercise ideas include:  Walking at a rate of at least 4.5 miles per hour.  Jogging or running at a rate of 5 miles per hour.  Biking at a rate of at least 10 miles per hour.  Lap swimming.  Roller-skating or in-line skating.  Cross-country skiing.  Vigorous competitive sports, such as football, basketball, and soccer.  Jumping rope.  Aerobic dancing.   What are some everyday activities that can help me to get exercise?  Yard work, such as: ? Pushing a lawn mower. ? Raking and bagging leaves.  Washing and waxing your car.  Pushing a stroller.  Shoveling snow.  Gardening.  Washing windows or   floors. How can I be more active in my day-to-day activities?  Use the stairs instead of the elevator.  Take a walk during your lunch break.  If you drive, park your car farther away from work or school.  If you take public transportation, get off one stop early and walk the rest of the way.  Make all of your phone calls while standing up and walking  around.  Get up, stretch, and walk around every 30 minutes throughout the day. What guidelines should I follow while exercising?  Do not exercise so much that you hurt yourself, feel dizzy, or get very short of breath.  Consult your health care provider before starting a new exercise program.  Wear comfortable clothes and shoes with good support.  Drink plenty of water while you exercise to prevent dehydration or heat stroke. Body water is lost during exercise and must be replaced.  Work out until you breathe faster and your heart beats faster. This information is not intended to replace advice given to you by your health care provider. Make sure you discuss any questions you have with your health care provider.  

## 2018-04-18 ENCOUNTER — Encounter: Payer: Self-pay | Admitting: Family Medicine

## 2018-04-19 NOTE — Telephone Encounter (Signed)
Information has been added to patients chart. Unable to see records from [place], No record release on file.

## 2018-04-25 ENCOUNTER — Encounter: Payer: Self-pay | Admitting: Family Medicine

## 2018-05-12 NOTE — Progress Notes (Signed)
57 y.o. G24P1001 Married Caucasian female here for annual exam.    Uses a standing desk and this helps with her bladder function and voiding.  Can leak with cough, laugh, or sneeze. She did not go through with her urology appointment this summer that she had requested.  Likes her estrogen patch.  Feels it makes her more calm.   Heat rash under her breasts.  Patient is caring her husband at home. Stressful. Neighbors are helping.  Taking Celexa and Trazodone.  PCP managing.   PCP:   Earl Lagos, MD  No LMP recorded. Patient has had a hysterectomy.           Sexually active: Yes.    The current method of family planning is status post hysterectomy--ovaries remain.    Exercising: No.   Smoker:  no  Health Maintenance: Pap:  03-19-17 Neg:Neg HR HPV, 02-21-16 Neg:Neg HR HPV History of abnormal Pap:  Yes,  at 56 y.o. had pre-cancerous cells on pap which lead to hysterectomy. MMG:  01-13-18 Neg/Density B/BiRads1  Colonoscopy: 01-09-14 normal with Dr. Penelope Coop except for diverticulitis and internal hemorrhoids;next due 12/2023. BMD: 2010  Result: normal at [facility] thru wk) TDaP:  02-01-15 Gardasil:   no HIV: PCP Hep C: PCP Screening Labs:   PCP. Flu vaccine done.    reports that she has never smoked. She has never used smokeless tobacco. She reports that she drinks about 1.0 standard drinks of alcohol per week. She reports that she does not use drugs.  Past Medical History:  Diagnosis Date  . Abnormal Pap smear of cervix    --age 52 had precancerous changes on pap and had hysterectomy  . Alopecia   . Anemia   . Anxiety   . Diverticulitis   . Endometriosis   . Family history of kidney cancer   . Family history of ovarian cancer   . Family history of prostate cancer   . Family history of uterine cancer   . High cholesterol   . Migraines   . Renal stones   . Uterine cancer ([place])    dx at 82    Past Surgical History:  Procedure Laterality Date  . ABDOMINAL  HYSTERECTOMY     L-TAH--ovaries remain  . left kidney blockage Left    age [age], and age 71  . TONSILLECTOMY AND ADENOIDECTOMY     -age 46  . urethral stent Left    age 39    Current Outpatient Medications  Medication Sig Dispense Refill  . atorvastatin (LIPITOR) 40 MG tablet Take 1 tablet (40 mg total) by mouth at bedtime. 90 tablet 1  . citalopram (CELEXA) 40 MG tablet Take 1 tablet (40 mg total) by mouth at bedtime. 90 tablet 1  . Cyanocobalamin (VITAMIN B-12 PO) Take 1 tablet by mouth daily.    Marland Kitchen estradiol (VIVELLE-DOT) 0.0375 MG/24HR PLACE 1 PATCH ANYWHERE ON LOWER ABDOMEN AND CHANGE TWICE WEEKLY 24 patch 3  . loratadine (CLARITIN) 10 MG tablet Take 10 mg by mouth daily.    Marland Kitchen MELATONIN PO Take 2 tablets by mouth at bedtime.     . Multiple Vitamin (MULTIVITAMIN) capsule Take 1 capsule by mouth daily.    . polycarbophil (FIBERCON) 625 MG tablet Take 625 mg by mouth daily.    Vladimir Faster Glycol-Propyl Glycol (SYSTANE) 0.4-0.3 % SOLN Place 1 drop into both eyes 2 (two) times daily.    . SUMAtriptan (IMITREX) 50 MG tablet Take 1 tablet (50 mg total) by mouth as  needed. 10 tablet 3  . traZODone (DESYREL) 50 MG tablet Take 0.5-1 tablets (25-50 mg total) by mouth at bedtime as needed for sleep. 30 tablet 3   No current facility-administered medications for this visit.     Family History  Problem Relation Age of Onset  . Diabetes Mother 72  . Other Mother        passed away from a blood clot after sinus surgery  . Migraines Mother   . COPD Father 20  . Diabetes Father   . Diabetes Sister   . Cancer Maternal Grandfather 45       dec metastatic prostate ca  . Leukemia Paternal Aunt   . Cancer Cousin        uterine and ovarian  . Cancer Cousin        uterine and ovarian  . Kidney cancer Paternal Uncle        dx in his late 36s-30s  . Glomerulonephritis Paternal Uncle     Review of Systems  Skin: Positive for rash.       Rash under breasts Hair loss    Exam:   BP 110/70  (BP Location: Right Arm, Patient Position: Sitting, Cuff Size: Normal)   Pulse 76   Resp 14   Ht 5\' 2"  (1.575 m)   Wt 173 lb (78.5 kg)   BMI 31.64 kg/m     General appearance: alert, cooperative and appears stated age Head: Normocephalic, without obvious abnormality, atraumatic Neck: no adenopathy, supple, symmetrical, trachea midline and thyroid normal to inspection and palpation Lungs: clear to auscultation bilaterally Breasts: normal appearance, no masses or tenderness, No nipple retraction or dimpling, No nipple discharge or bleeding, No axillary or supraclavicular adenopathy Heart: regular rate and rhythm Abdomen: soft, non-tender; no masses, no organomegaly Extremities: extremities normal, atraumatic, no cyanosis or edema Skin: Skin color, texture, turgor normal. No rashes or lesions Lymph nodes: Cervical, supraclavicular, and axillary nodes normal. No abnormal inguinal nodes palpated Neurologic: Grossly normal  Pelvic: External genitalia:  no lesions              Urethra:  normal appearing urethra with no masses, tenderness or lesions              Bartholins and Skenes: normal                 Vagina: normal appearing vagina with normal color and discharge, no lesions.  Almost second degree rectocele.              Cervix:  absent              Pap taken: Yes.   Bimanual Exam:  Uterus:  Absent.               Adnexa: no mass, fullness, tenderness              Rectal exam: Yes.  .  Confirms.              Anus:  normal sphincter tone, no lesions  Chaperone was present for exam.  Assessment:   Well woman visit with normal exam. Status post hysterectomy for cervical dysplasia age 73.  Ovaries remain.  ERT.  GSI.  Rectocele. Alopecia. Paternal family with cancer history.  Negative genetic testing.  Had POLE VUS.  Caregiver stress.   Plan: Mammogram screening. Recommended self breast awareness. Pap and HR HPV as above. Guidelines for Calcium, Vitamin D, regular exercise  program including cardiovascular and weight bearing exercise.  Refill of ERT.  Discussed WHI and risks of stroke, DVT, PE. She will reach out to employee assistance.  We talked about asking for more help at home for herself and for her husband.  Lab with PCP.  She did not follow through with referral to urology yet.  She will call to reschedule this. Follow up annually and prn.    After visit summary provided.

## 2018-05-13 ENCOUNTER — Other Ambulatory Visit: Payer: Self-pay

## 2018-05-13 ENCOUNTER — Other Ambulatory Visit (HOSPITAL_COMMUNITY)
Admission: RE | Admit: 2018-05-13 | Discharge: 2018-05-13 | Disposition: A | Discharge status: Home / Self Care | Payer: BLUE CROSS/BLUE SHIELD | Source: Ambulatory Visit | Attending: Obstetrics and Gynecology | Admitting: Obstetrics and Gynecology

## 2018-05-13 ENCOUNTER — Encounter: Payer: Self-pay | Admitting: Obstetrics and Gynecology

## 2018-05-13 ENCOUNTER — Ambulatory Visit: Payer: BLUE CROSS/BLUE SHIELD | Admitting: Obstetrics and Gynecology

## 2018-05-13 VITALS — BP 110/70 | HR 76 | Resp 14 | Ht 62.0 in | Wt 173.0 lb

## 2018-05-13 DIAGNOSIS — Z01419 Encounter for gynecological examination (general) (routine) without abnormal findings: Secondary | ICD-10-CM | POA: Insufficient documentation

## 2018-05-13 DIAGNOSIS — Z636 Dependent relative needing care at home: Secondary | ICD-10-CM

## 2018-05-13 MED ORDER — NYSTATIN 100000 UNIT/GM EX POWD: Freq: Three times a day (TID) | CUTANEOUS | 2 refills | Status: DC

## 2018-05-13 MED ORDER — ESTRADIOL 0.0375 MG/24HR TD PTTW: MEDICATED_PATCH | TRANSDERMAL | 3 refills | Status: DC

## 2018-05-17 LAB — CYTOLOGY - PAP
Diagnosis: NEGATIVE
HPV: NOT DETECTED

## 2018-05-19 NOTE — Progress Notes (Signed)
Maria Gross DOB: 10-22-1960 Encounter date: 05/20/2018  This is a 57 y.o. female who presents for complete physical   History of present illness/Additional concerns: Trial of benadryl/melatonin for sleep or trazodone. Tried up to 2 of trazodone and still not sleeping. Taking 2 melatonin (10mg ); and taking benadryl. Still not sleeping. Falls asleep, but keeps waking up throughout night. Maybe gets 2-3 hours and then wakes continuously. Husband is sleeping better through night. When stressing, she doesn't eat as well.   Situation with husband; fell at PT yesterday. Wears on her. Feels that his medical issues has really affected her mood. Trying to manage everything every day. Feels like this is entire source for her mood. Previously husband was grocery shopping, cooking, cleaning. Now she is overwhelmed every day. Trying to reach out and get some help. Reaching out to services to help with appointments; getting him to PT/OT. Husband fell over garden hose on 04/12/18. Was in hospital 6 weeks and had surgery for spinal cord injury/spinal stenosis. Came home but using walker, still minimal lower extremity ability. Was down for hours before postman found him that day. Lost 30lbs; lost muscle mass.   Past Medical History:  Diagnosis Date  . Abnormal Pap smear of cervix    --age 56 had precancerous changes on pap and had hysterectomy  . Alopecia   . Anemia   . Anxiety   . Diverticulitis   . Endometriosis   . Family history of kidney cancer   . Family history of ovarian cancer   . Family history of prostate cancer   . Family history of uterine cancer   . High cholesterol   . Migraines   . Renal stones   . Uterine cancer ([place])    dx at 66   Past Surgical History:  Procedure Laterality Date  . ABDOMINAL HYSTERECTOMY     L-TAH--ovaries remain  . left kidney blockage Left    age [age], and age 49  . TONSILLECTOMY AND ADENOIDECTOMY     -age 60  . urethral stent Left    age 85   Allergies   Allergen Reactions  . Ivp Dye [Iodinated Diagnostic Agents] Other (See Comments)    seizures   Current Meds  Medication Sig  . atorvastatin (LIPITOR) 40 MG tablet Take 1 tablet (40 mg total) by mouth at bedtime.  . citalopram (CELEXA) 40 MG tablet Take 1 tablet (40 mg total) by mouth at bedtime.  . Cyanocobalamin (VITAMIN B-12 PO) Take 1 tablet by mouth daily.  Marland Kitchen estradiol (VIVELLE-DOT) 0.0375 MG/24HR PLACE 1 PATCH ANYWHERE ON LOWER ABDOMEN AND CHANGE TWICE WEEKLY  . loratadine (CLARITIN) 10 MG tablet Take 10 mg by mouth daily.  Marland Kitchen MELATONIN PO Take 2 tablets by mouth at bedtime.   . Multiple Vitamin (MULTIVITAMIN) capsule Take 1 capsule by mouth daily.  Marland Kitchen nystatin (MYCOSTATIN/NYSTOP) powder Apply topically 3 (three) times daily. Apply to affected area for up to 7 days  . polycarbophil (FIBERCON) 625 MG tablet Take 625 mg by mouth daily.  Vladimir Faster Glycol-Propyl Glycol (SYSTANE) 0.4-0.3 % SOLN Place 1 drop into both eyes 2 (two) times daily.  . SUMAtriptan (IMITREX) 50 MG tablet Take 1 tablet (50 mg total) by mouth as needed.  . [DISCONTINUED] traZODone (DESYREL) 50 MG tablet Take 0.5-1 tablets (25-50 mg total) by mouth at bedtime as needed for sleep. (Patient taking differently: Take 25-50 mg by mouth at bedtime as needed for sleep. Taking 2 tabs at bedtime)   Social History  Tobacco Use  . Smoking status: Never Smoker  . Smokeless tobacco: Never Used  Substance Use Topics  . Alcohol use: Yes    Alcohol/week: 1.0 standard drinks    Types: 1 Glasses of wine per week   Family History  Problem Relation Age of Onset  . Diabetes Mother 67  . Other Mother        passed away from a blood clot after sinus surgery  . Migraines Mother   . COPD Father 21  . Diabetes Father   . Diabetes Sister   . Cancer Maternal Grandfather 57       dec metastatic prostate ca  . Leukemia Paternal Aunt   . Cancer Cousin        uterine and ovarian  . Cancer Cousin        uterine and ovarian  .  Kidney cancer Paternal Uncle        dx in his late 80s-30s  . Glomerulonephritis Paternal Uncle      Review of Systems  Constitutional: Negative for activity change, appetite change, chills, fatigue, fever and unexpected weight change.  HENT: Negative for congestion, ear pain, hearing loss, sinus pressure, sinus pain, sore throat and trouble swallowing.   Eyes: Negative for pain and visual disturbance.  Respiratory: Negative for cough, chest tightness, shortness of breath and wheezing.   Cardiovascular: Negative for chest pain, palpitations and leg swelling.  Gastrointestinal: Negative for abdominal pain, blood in stool, constipation, diarrhea, nausea and vomiting.  Genitourinary: Negative for difficulty urinating and menstrual problem.  Musculoskeletal: Negative for arthralgias and back pain.  Skin: Negative for rash.  Neurological: Negative for dizziness, weakness, numbness and headaches.  Hematological: Negative for adenopathy. Does not bruise/bleed easily.  Psychiatric/Behavioral: Negative for sleep disturbance and suicidal ideas. The patient is not nervous/anxious.     CBC:  Lab Results  Component Value Date   WBC 8.5 01/24/2018   WBC 8.6 12/26/2013   HGB 13.5 01/24/2018   HCT 40.5 01/24/2018   MCH 30.6 01/24/2018   MCH 30.0 12/26/2013   MCHC 33.3 01/24/2018   MCHC 33.2 12/26/2013   RDW 13.6 01/24/2018   PLT 229 01/24/2018   CMP: Lab Results  Component Value Date   NA 143 01/24/2018   K 4.0 01/24/2018   CL 101 01/24/2018   CO2 26 01/24/2018   GLUCOSE 87 01/24/2018   GLUCOSE 91 12/26/2013   BUN 15 01/24/2018   CREATININE 0.78 01/24/2018   LABGLOB 2.6 01/24/2018   GFRAA 98 01/24/2018   CALCIUM 9.3 01/24/2018   PROT 7.0 01/24/2018   AGRATIO 1.7 01/24/2018   BILITOT 0.7 01/24/2018   ALKPHOS 66 01/24/2018   ALT 34 (H) 01/24/2018   AST 23 01/24/2018   LIPID: Lab Results  Component Value Date   CHOL 146 03/22/2017   TRIG 110 03/22/2017    Objective:  BP  120/64 (BP Location: Left Arm, Patient Position: Sitting, Cuff Size: Normal)   Pulse 72   Temp 97.9 F (36.6 C) (Oral)   Ht 5\' 3"  (1.6 m)   Wt 175 lb 6.4 oz (79.6 kg)   SpO2 95%   BMI 31.07 kg/m   Weight: 175 lb 6.4 oz (79.6 kg)   BP Readings from Last 3 Encounters:  05/20/18 120/64  05/13/18 110/70  04/15/18 120/60   Wt Readings from Last 3 Encounters:  05/20/18 175 lb 6.4 oz (79.6 kg)  05/13/18 173 lb (78.5 kg)  04/15/18 171 lb 6.4 oz (77.7 kg)  Physical Exam  Constitutional: She is oriented to person, place, and time. She appears well-developed and well-nourished. No distress.  HENT:  Head: Normocephalic and atraumatic.  Right Ear: Tympanic membrane, external ear and ear canal normal.  Left Ear: External ear normal. Tympanic membrane is erythematous. Tympanic membrane is not scarred and not perforated.  Mouth/Throat: Oropharynx is clear and moist. No oropharyngeal exudate.  Left TM dull; no obvious fluid, but appears to be recovering from possible otitis media.  Eyes: Pupils are equal, round, and reactive to light. Conjunctivae are normal.  Neck: Normal range of motion. Neck supple. No thyromegaly present.  Cardiovascular: Normal rate, regular rhythm and normal heart sounds. Exam reveals no gallop and no friction rub.  No murmur heard. Pulmonary/Chest: Effort normal and breath sounds normal.  Abdominal: Soft. Bowel sounds are normal. She exhibits no distension and no mass. There is no tenderness. There is no guarding. No hernia.  Musculoskeletal: Normal range of motion. She exhibits no edema, tenderness or deformity.  Lymphadenopathy:    She has no cervical adenopathy.  Neurological: She is alert and oriented to person, place, and time. She has normal strength. She displays normal reflexes.  Reflex Scores:      Tricep reflexes are 2+ on the right side and 2+ on the left side.      Bicep reflexes are 2+ on the right side and 2+ on the left side.      Brachioradialis  reflexes are 2+ on the right side and 2+ on the left side.      Patellar reflexes are 2+ on the right side and 2+ on the left side. Skin: Skin is warm and dry. No rash noted.  Psychiatric: She has a normal mood and affect. Her speech is normal and behavior is normal. Thought content normal.    Assessment/Plan: Health Maintenance Due  Topic Date Due  . Hepatitis C Screening  Nov 26, 1960  . HIV Screening  10/06/1976   Health Maintenance reviewed - reviewed. 1. Preventative health care Work on ways to decrease stress. Finding help for house responsibilities; help with care for husband.  2. Insomnia, unspecified type Trial lunesta (appears to be covered by insurance where others are not and has failed trial trazodone/melatonin/benadryl)  3. Dysfunction of left eustachian tube augmentin printed if pain starts in left ear as TM is dull and she has had pressure issues from pain. Suspect just residual ET dysfunction that should resolve with flonase/claritin.  4. Need for shingles vaccine Completed.  5. Skin lesion: trial cortisone with vasoline; return for biopsy if not resolving.   Return in about 6 months (around 11/19/2018) for Chronic condition visit.  Micheline Rough, MD

## 2018-05-20 ENCOUNTER — Ambulatory Visit (INDEPENDENT_AMBULATORY_CARE_PROVIDER_SITE_OTHER): Payer: BLUE CROSS/BLUE SHIELD | Admitting: Family Medicine

## 2018-05-20 ENCOUNTER — Encounter: Payer: Self-pay | Admitting: Family Medicine

## 2018-05-20 VITALS — BP 120/64 | HR 72 | Temp 97.9°F | Ht 63.0 in | Wt 175.4 lb

## 2018-05-20 DIAGNOSIS — G4700 Insomnia, unspecified: Secondary | ICD-10-CM

## 2018-05-20 DIAGNOSIS — Z23 Encounter for immunization: Secondary | ICD-10-CM

## 2018-05-20 DIAGNOSIS — H6982 Other specified disorders of Eustachian tube, left ear: Secondary | ICD-10-CM

## 2018-05-20 DIAGNOSIS — Z0000 Encounter for general adult medical examination without abnormal findings: Secondary | ICD-10-CM

## 2018-05-20 MED ORDER — AMOXICILLIN-POT CLAVULANATE 875-125 MG PO TABS: 1.0000 | ORAL_TABLET | Freq: Two times a day (BID) | ORAL | 0 refills | Status: DC

## 2018-05-20 MED ORDER — ESZOPICLONE 1 MG PO TABS: 1.0000 mg | ORAL_TABLET | Freq: Every evening | ORAL | 2 refills | Status: DC | PRN

## 2018-05-20 NOTE — Patient Instructions (Signed)
Continue with flonase nasal spray to help with ear clogging.

## 2018-05-27 ENCOUNTER — Other Ambulatory Visit: Payer: Self-pay | Admitting: Obstetrics and Gynecology

## 2018-06-03 ENCOUNTER — Telehealth: Payer: Self-pay | Admitting: Obstetrics and Gynecology

## 2018-06-03 NOTE — Telephone Encounter (Signed)
Left message on voicemail to call and reschedule cancelled appointment. °

## 2018-08-15 ENCOUNTER — Other Ambulatory Visit: Payer: Self-pay | Admitting: Family Medicine

## 2018-08-15 DIAGNOSIS — G4700 Insomnia, unspecified: Secondary | ICD-10-CM

## 2018-09-13 ENCOUNTER — Telehealth: Payer: Self-pay | Admitting: Obstetrics and Gynecology

## 2018-09-13 NOTE — Telephone Encounter (Signed)
Patient says she is having severe pain, soreness in her breasts and very fatigue.

## 2018-09-13 NOTE — Telephone Encounter (Signed)
Spoke with patient. Reports increase in hot flashes, bilateral breast tenderness and fatigue for the last 2 wks. Currently using Vivelle-dot 0.0375mg  patch twice wkly, changes Sun & Thurs, has been using this for approximately 2 yrs. Denies vaginal bleeding, headache, fever/chills, N/V. Denies skin changes, lumps or nipple d/c. Patient states she thinks she needs to cinsider changing HRT. Recommended OV for further evaluation and discussion with Dr. Quincy Simmonds. OV scheduled for 2/19 at 2pm. Advised Dr. Quincy Simmonds will review, I will return call if any additional recommendations.   Routing to provider for final review. Patient is agreeable to disposition. Will close encounter.

## 2018-09-14 ENCOUNTER — Ambulatory Visit (INDEPENDENT_AMBULATORY_CARE_PROVIDER_SITE_OTHER): Payer: BLUE CROSS/BLUE SHIELD | Admitting: Obstetrics and Gynecology

## 2018-09-14 ENCOUNTER — Other Ambulatory Visit: Payer: Self-pay

## 2018-09-14 ENCOUNTER — Other Ambulatory Visit: Payer: Self-pay | Admitting: Family Medicine

## 2018-09-14 ENCOUNTER — Encounter: Payer: Self-pay | Admitting: Obstetrics and Gynecology

## 2018-09-14 VITALS — BP 122/78 | HR 76 | Ht 62.0 in | Wt 175.6 lb

## 2018-09-14 DIAGNOSIS — N951 Menopausal and female climacteric states: Secondary | ICD-10-CM

## 2018-09-14 DIAGNOSIS — R5383 Other fatigue: Secondary | ICD-10-CM | POA: Diagnosis not present

## 2018-09-14 DIAGNOSIS — G43809 Other migraine, not intractable, without status migrainosus: Secondary | ICD-10-CM

## 2018-09-14 NOTE — Progress Notes (Signed)
GYNECOLOGY  VISIT   HPI: 58 y.o.   Married  Caucasian  female   G1P1001 with No LMP recorded. Patient has had a hysterectomy.   here for bilateral breast tenderness, moodiness, hot flashes and night sweats for past 2-3 weeks.  Fatigue and breast tenderness are her biggest concerns.   Also experiencing fatigue. No hx of sleep apnea.  Husband up several times at night, but she is not struggling with this. Taking Lunesta since the fall, 2019.  Thinks she is sleeping adequately.   Feels she has swollen breasts.  Hurts to put on a bra.   Still using her estrogen patch and changing it twice weekly.  No change in prescription.   Denies suicidal ideation.  On Celexa 40 mg.  Work is bothering her and she is crying on the way home. 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   FSH 105.6 and estradiol 27.4 on 10/05/14. A1C 5.9 01/24/18.  She has been getting assistance for the stress at her home.  She has organized her life more.  Her husband is better now, walking with a cane.  They are not sexually active.  GYNECOLOGIC HISTORY: No LMP recorded. Patient has had a hysterectomy. Contraception: Hysterectomy Menopausal hormone therapy: Vivelle Dot Last mammogram:  01-13-18 3D Neg/density B/BiRads1 Last pap smear: 05-23-18 Neg:Neg HR HPV        OB History    Gravida  1   Para  1   Term  1   Preterm      AB      Living  1     SAB      TAB      Ectopic      Multiple      Live Births                 Patient Active Problem List   Diagnosis Date Noted  . Genetic testing 05/26/2017  . Family history of uterine cancer   . Family history of ovarian cancer   . Family history of kidney cancer   . Family history of prostate cancer   . Uterine cancer [facility])     Past Medical History:  Diagnosis Date  . Abnormal Pap smear of cervix    --age 71 had precancerous changes on pap and had hysterectomy  . Alopecia   . Anemia   . Anxiety   . Diverticulitis   .  Endometriosis   . Family history of kidney cancer   . Family history of ovarian cancer   . Family history of prostate cancer   . Family history of uterine cancer   . High cholesterol   . Migraines   . Renal stones   . Uterine cancer ([place])    dx at 33    Past Surgical History:  Procedure Laterality Date  . ABDOMINAL HYSTERECTOMY     L-TAH--ovaries remain  . left kidney blockage Left    age [age], and age 62  . TONSILLECTOMY AND ADENOIDECTOMY     -age 63  . urethral stent Left    age 74    Current Outpatient Medications  Medication Sig Dispense Refill  . atorvastatin (LIPITOR) 40 MG tablet Take 1 tablet (40 mg total) by mouth at bedtime. 90 tablet 1  . citalopram (CELEXA) 40 MG tablet Take 1 tablet (40 mg total) by mouth at bedtime. 90 tablet 1  . Cyanocobalamin (VITAMIN B-12 PO) Take 1 tablet by mouth daily.    Marland Kitchen  estradiol (VIVELLE-DOT) 0.0375 MG/24HR PLACE 1 PATCH ANYWHERE ON LOWER ABDOMEN AND CHANGE TWICE WEEKLY 24 patch 3  . eszopiclone (LUNESTA) 1 MG TABS tablet TAKE 1 TO 2 TABLETS BY MOUTH IMMEDIATELY BEFORE BEDTIME AS NEEDED FOR SLEEP 30 tablet 2  . loratadine (CLARITIN) 10 MG tablet Take 10 mg by mouth daily.    Marland Kitchen MELATONIN PO Take 2 tablets by mouth at bedtime.     . Multiple Vitamin (MULTIVITAMIN) capsule Take 1 capsule by mouth daily.    Marland Kitchen nystatin (MYCOSTATIN/NYSTOP) powder Apply topically 3 (three) times daily. Apply to affected area for up to 7 days 30 g 2  . polycarbophil (FIBERCON) 625 MG tablet Take 625 mg by mouth daily.    Vladimir Faster Glycol-Propyl Glycol (SYSTANE) 0.4-0.3 % SOLN Place 1 drop into both eyes 2 (two) times daily.    . SUMAtriptan (IMITREX) 50 MG tablet TAKE 1 TABLET BY MOUTH DAILY AS NEEDED 10 tablet 3   No current facility-administered medications for this visit.      ALLERGIES: Ivp dye [iodinated diagnostic agents]  Family History  Problem Relation Age of Onset  . Diabetes Mother 63  . Other Mother        passed away from a blood clot  after sinus surgery  . Migraines Mother   . COPD Father 44  . Diabetes Father   . Diabetes Sister   . Cancer Maternal Grandfather 75       dec metastatic prostate ca  . Leukemia Paternal Aunt   . Cancer Cousin        uterine and ovarian  . Cancer Cousin        uterine and ovarian  . Kidney cancer Paternal Uncle        dx in his late 55s-30s  . Glomerulonephritis Paternal Uncle     Social History   Socioeconomic History  . Marital status: Married    Spouse name: Not on file  . Number of children: Not on file  . Years of education: Not on file  . Highest education level: Not on file  Occupational History  . Not on file  Social Needs  . Financial resource strain: Not on file  . Food insecurity:    Worry: Not on file    Inability: Not on file  . Transportation needs:    Medical: Not on file    Non-medical: Not on file  Tobacco Use  . Smoking status: Never Smoker  . Smokeless tobacco: Never Used  Substance and Sexual Activity  . Alcohol use: Yes    Alcohol/week: 1.0 standard drinks    Types: 1 Glasses of wine per week  . Drug use: No  . Sexual activity: Yes    Partners: Male    Comment: Hyst  Lifestyle  . Physical activity:    Days per week: Not on file    Minutes per session: Not on file  . Stress: Not on file  Relationships  . Social connections:    Talks on phone: Not on file    Gets together: Not on file    Attends religious service: Not on file    Active member of club or organization: Not on file    Attends meetings of clubs or organizations: Not on file    Relationship status: Not on file  . Intimate partner violence:    Fear of current or ex partner: Not on file    Emotionally abused: Not on file    Physically abused: Not  on file    Forced sexual activity: Not on file  Other Topics Concern  . Not on file  Social History Narrative  . Not on file    Review of Systems  Constitutional:       Craving ice  Skin:       Hair loss  All other  systems reviewed and are negative.   PHYSICAL EXAMINATION:    BP 122/78 (BP Location: Right Arm, Patient Position: Sitting, Cuff Size: Normal)   Pulse 76   Ht 5\' 2"  (1.575 m)   Wt 175 lb 9.6 oz (79.7 kg)   BMI 32.12 kg/m     General appearance: alert, cooperative and appears stated age Head: Normocephalic, without obvious abnormality, atraumatic Neck: no adenopathy, supple, symmetrical, trachea midline and thyroid normal to inspection and palpation Lungs: clear to auscultation bilaterally Breasts: normal appearance, no masses or tenderness, No nipple retraction or dimpling, No nipple discharge or bleeding, No axillary or supraclavicular adenopathy Heart: regular rate and rhythm Abdomen: soft, non-tender, no masses,  no organomegaly.   Chaperone was present for exam.  ASSESSMENT  Status post hysterectomy.  Ovaries remain.  Postmenopausal by labs from 2016.  Breast tenderness.  Normal breast exam.  Fatigue.   PLAN  We talked about reasons for fatigue.  Will check general labs - TSH, CBC, CMP, A1C, estradiol, vit D, and vit B12.  Continue current ERT.  Will make a decision regarding her ERT and dosage after her labs are back.  I recommend she increases her exercise.   An After Visit Summary was printed and given to the patient.  __25____ minutes face to face time of which over 50% was spent in counseling.

## 2018-09-15 LAB — CBC
Hematocrit: 39.6 % (ref 34.0–46.6)
Hemoglobin: 13 g/dL (ref 11.1–15.9)
MCH: 30.5 pg (ref 26.6–33.0)
MCHC: 32.8 g/dL (ref 31.5–35.7)
MCV: 93 fL (ref 79–97)
Platelets: 242 10*3/uL (ref 150–450)
RBC: 4.26 x10E6/uL (ref 3.77–5.28)
RDW: 12.7 % (ref 11.7–15.4)
WBC: 7.3 10*3/uL (ref 3.4–10.8)

## 2018-09-15 LAB — ESTRADIOL: Estradiol: 71.7 pg/mL

## 2018-09-15 LAB — HEMOGLOBIN A1C
Est. average glucose Bld gHb Est-mCnc: 120 mg/dL
Hgb A1c MFr Bld: 5.8 % — ABNORMAL HIGH (ref 4.8–5.6)

## 2018-09-15 LAB — VITAMIN D 25 HYDROXY (VIT D DEFICIENCY, FRACTURES): Vit D, 25-Hydroxy: 32.9 ng/mL (ref 30.0–100.0)

## 2018-09-15 LAB — VITAMIN B12: Vitamin B-12: 1790 pg/mL — ABNORMAL HIGH (ref 232–1245)

## 2018-09-15 LAB — TSH: TSH: 1.66 u[IU]/mL (ref 0.450–4.500)

## 2018-11-13 ENCOUNTER — Other Ambulatory Visit: Payer: Self-pay | Admitting: Family Medicine

## 2018-11-13 DIAGNOSIS — G4700 Insomnia, unspecified: Secondary | ICD-10-CM

## 2018-11-14 NOTE — Telephone Encounter (Signed)
lipitor and celexa Last filled 04/15/18 lunesta last filled 08/16/18  Last OV 09/14/2018  Ok to fill these?

## 2018-11-15 ENCOUNTER — Telehealth: Payer: Self-pay

## 2018-11-15 NOTE — Telephone Encounter (Signed)
Patient called back. Appointment has been scheduled

## 2018-11-15 NOTE — Telephone Encounter (Signed)
Patient called to reschedule her appointment for 11/18/2018 at 4:30 and spoke with Roselyn Reef. Roselyn Reef canceled the appointment and then the patient decided she wanted to keep the same appointment. the 4:30 slot is no longer open due to the office closing early. Patient will need to schedule for a different time.  Left message for patient to call back. CRM created.

## 2018-11-18 ENCOUNTER — Ambulatory Visit: Payer: BLUE CROSS/BLUE SHIELD | Admitting: Family Medicine

## 2018-11-25 ENCOUNTER — Telehealth: Payer: Self-pay | Admitting: *Deleted

## 2018-11-25 ENCOUNTER — Ambulatory Visit (INDEPENDENT_AMBULATORY_CARE_PROVIDER_SITE_OTHER): Payer: BC Managed Care – PPO | Admitting: Family Medicine

## 2018-11-25 ENCOUNTER — Other Ambulatory Visit: Payer: Self-pay

## 2018-11-25 DIAGNOSIS — E785 Hyperlipidemia, unspecified: Secondary | ICD-10-CM

## 2018-11-25 DIAGNOSIS — R7989 Other specified abnormal findings of blood chemistry: Secondary | ICD-10-CM

## 2018-11-25 DIAGNOSIS — G4700 Insomnia, unspecified: Secondary | ICD-10-CM | POA: Diagnosis not present

## 2018-11-25 DIAGNOSIS — R7301 Impaired fasting glucose: Secondary | ICD-10-CM

## 2018-11-25 DIAGNOSIS — Z1159 Encounter for screening for other viral diseases: Secondary | ICD-10-CM

## 2018-11-25 MED ORDER — ALPRAZOLAM 1 MG PO TABS: 0.5000 mg | ORAL_TABLET | Freq: Every evening | ORAL | 2 refills | Status: DC | PRN

## 2018-11-25 NOTE — Telephone Encounter (Signed)
-----   Message from Caren Macadam, MD sent at 11/25/2018  8:28 AM EDT ----- Please schedule physical and bloodwork prior to physical in 6 months time

## 2018-11-25 NOTE — Progress Notes (Signed)
Virtual Visit via Video Note  I connected with Maria Gross  on 11/25/18 at  8:00 AM EDT by a video enabled telemedicine application and verified that I am speaking with the correct person using two identifiers.  Location patient: home Location provider:work or home office Persons participating in the virtual visit: patient, provider  I discussed the limitations of evaluation and management by telemedicine and the availability of in person appointments. The patient expressed understanding and agreed to proceed.   Maria Gross DOB: June 24, 1961 Encounter date: 11/25/2018  This is a 58 y.o. female who presents with No chief complaint on file.   History of present illness:  HPI   Insomnia: had tried trazodone, benadryl/melatonin and then lunesta at last visit. Not sleeping well. Ran out of [place] and insurance will only let her get 15 tablets. She previously was on xanax at bedtime which helped her fall and stay asleep. This worked well for her for years. She was stable with this dose for years. She has difficulty with falling asleep and with waking up early once she falls asleep and getting back to sleep after early morning (2-3am).   Saw obgyn in /2020 for fatigue. Had bloodwork eval which was stable. Suggested PCP follow up to discuss lunesta/celexa. Was taking B12 supplement at time of bloodwork which she states is reason for elevation. Would like to recheck and make sure back into normal range. She states that she is doing better since seeing obgyn. With reduction in estrogen patch she is feeling better. "I'm still going through menopause which is hateful", but decrease has helped.   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 She is tired from not sleeping. Adjusting to husbands condition.   Allergies: takes claritin year round. This is her worst season. She has been using eye drops more frequently.    Anxiety/Depression: states last month has been more stressful, but she feels she is doing quite well adjusting to COVID situation and stresses related to this. Due to issues with sleeping she has been less active. She hasn't had energy to paint. Managing a lot at work and home, but this has now become a new normal for her.   Migraines: has had more due to weather. Has taken imitrex 2-3 times in last few months. Medication works well for her when she takes it.  Allergies  Allergen Reactions  . Ivp Dye [Iodinated Diagnostic Agents] Other (See Comments)    seizures   No outpatient medications have been marked as taking for the 11/25/18 encounter (Office Visit) with Caren Macadam, MD.    Review of Systems  Constitutional: Negative for chills, fatigue and fever.  Respiratory: Negative for cough, chest tightness, shortness of breath and wheezing.   Cardiovascular: Negative for chest pain, palpitations and leg swelling.  Neurological: Positive for headaches (see hpi).  Psychiatric/Behavioral: Positive for decreased concentration and sleep disturbance. Negative for suicidal ideas. The patient is nervous/anxious.     Objective:  There were no vitals taken for this visit.      BP Readings from Last 3 Encounters:  09/14/18 122/78  05/20/18 120/64  05/13/18 110/70   Wt Readings from Last 3 Encounters:  09/14/18 175 lb 9.6 oz (79.7 kg)  05/20/18 175 lb 6.4 oz (79.6 kg)  05/13/18 173 lb (78.5 kg)    EXAM:  GENERAL: alert, oriented, appears well and in no acute distress  LUNGS: on inspection  no signs of respiratory distress, breathing rate appears normal, no obvious gross SOB, gasping or wheezing  PSYCH/NEURO: pleasant and cooperative, no obvious depression or anxiety, speech and thought processing grossly intact  Depression screen [facility] 2/9 11/25/2018 05/20/2018  Decreased Interest 3 -  Down, Depressed, Hopeless 1 3  PHQ - 2 Score 4 3  Altered sleeping 3 3  Tired, decreased  energy 1 3  Change in appetite 0 3  Feeling bad or failure about yourself  1 0  Trouble concentrating 1 0  Moving slowly or fidgety/restless 0 0  Suicidal thoughts 0 0  PHQ-9 Score 10 12  Difficult doing work/chores Very difficult Extremely dIfficult     Assessment/Plan  1. Insomnia, unspecified type Other medications have not worked well for her. Limited with options due to insurance coverage. Will go back to xanax for sleep. She is using half tab; will continue to review need. She is aware of risks of this controlled substance. - ALPRAZolam (XANAX) 1 MG tablet; Take 0.5-1 tablets (0.5-1 mg total) by mouth at bedtime as needed for anxiety or sleep.  Dispense: 30 tablet; Refill: 2  2. Impaired fasting glucose Recheck in fall. - Hemoglobin A1c; Future  3. High serum vitamin B12 Recheck levels in fall. She has decreased supplement. - Vitamin B12; Future  4. Hyperlipidemia, unspecified hyperlipidemia type Recheck levels in fall. - Comprehensive metabolic panel; Future - Lipid panel; Future    I discussed the assessment and treatment plan with the patient. The patient was provided an opportunity to ask questions and all were answered. The patient agreed with the plan and demonstrated an understanding of the instructions.   The patient was advised to call back or seek an in-person evaluation if the symptoms worsen or if the condition fails to improve as anticipated.  I provided 30 minutes of non-face-to-face time during this encounter.   Micheline Rough, MD

## 2018-11-25 NOTE — Telephone Encounter (Signed)
I left a message for the pt to return my call. 

## 2019-04-08 ENCOUNTER — Encounter: Payer: Self-pay | Admitting: Family Medicine

## 2019-04-11 ENCOUNTER — Telehealth: Payer: Self-pay | Admitting: *Deleted

## 2019-04-11 NOTE — Telephone Encounter (Signed)
Left a detailed message at the pts cell number with the information below and asked that she call back to schedule the appts.

## 2019-04-11 NOTE — Telephone Encounter (Signed)
-----   Message from Caren Macadam, MD sent at 04/10/2019  9:45 PM EDT ----- Maria Gross sent message through [place] that I cannot link in to respond to.   She was asking for blood sugar medication to be sent in for her.   I would suggest that she schedule to complete bloodwork (already ordered at last visit) since it has been over 6 months since we last checked this. Then schedule visit to review/discuss. All meds come with pros/cons and we need to know where we are starting from to make a good informed decision.

## 2019-04-14 ENCOUNTER — Other Ambulatory Visit (INDEPENDENT_AMBULATORY_CARE_PROVIDER_SITE_OTHER): Payer: BC Managed Care – PPO

## 2019-04-14 ENCOUNTER — Other Ambulatory Visit: Payer: BC Managed Care – PPO

## 2019-04-14 ENCOUNTER — Other Ambulatory Visit: Payer: Self-pay

## 2019-04-14 DIAGNOSIS — R7301 Impaired fasting glucose: Secondary | ICD-10-CM | POA: Diagnosis not present

## 2019-04-14 DIAGNOSIS — E785 Hyperlipidemia, unspecified: Secondary | ICD-10-CM | POA: Diagnosis not present

## 2019-04-14 DIAGNOSIS — Z1159 Encounter for screening for other viral diseases: Secondary | ICD-10-CM

## 2019-04-14 DIAGNOSIS — R7989 Other specified abnormal findings of blood chemistry: Secondary | ICD-10-CM | POA: Diagnosis not present

## 2019-04-14 LAB — COMPREHENSIVE METABOLIC PANEL
ALT: 19 U/L (ref 0–35)
AST: 18 U/L (ref 0–37)
Albumin: 4.3 g/dL (ref 3.5–5.2)
Alkaline Phosphatase: 53 U/L (ref 39–117)
BUN: 14 mg/dL (ref 6–23)
CO2: 30 mEq/L (ref 19–32)
Calcium: 9.3 mg/dL (ref 8.4–10.5)
Chloride: 102 mEq/L (ref 96–112)
Creatinine, Ser: 0.84 mg/dL (ref 0.40–1.20)
GFR: 69.76 mL/min (ref >60.00)
Glucose, Bld: 104 mg/dL — ABNORMAL HIGH (ref 70–99)
Potassium: 4.3 mEq/L (ref 3.5–5.1)
Sodium: 139 mEq/L (ref 135–145)
Total Bilirubin: 1.1 mg/dL (ref 0.2–1.2)
Total Protein: 6.8 g/dL (ref 6.0–8.3)

## 2019-04-14 LAB — HEMOGLOBIN A1C: Hgb A1c MFr Bld: 6.3 % (ref 4.6–6.5)

## 2019-04-14 LAB — LIPID PANEL
Cholesterol: 157 mg/dL (ref 0–200)
HDL: 55.5 mg/dL (ref >39.00)
LDL Cholesterol: 76 mg/dL (ref 0–99)
NonHDL: 101.37
Total CHOL/HDL Ratio: 3
Triglycerides: 128 mg/dL (ref 0.0–149.0)
VLDL: 25.6 mg/dL (ref 0.0–40.0)

## 2019-04-14 LAB — VITAMIN B12: Vitamin B-12: 1074 pg/mL — ABNORMAL HIGH (ref 211–911)

## 2019-04-17 LAB — HEPATITIS C ANTIBODY
Hepatitis C Ab: NONREACTIVE
SIGNAL TO CUT-OFF: 0.01 (ref <1.00)

## 2019-05-03 ENCOUNTER — Other Ambulatory Visit: Payer: Self-pay

## 2019-05-03 ENCOUNTER — Encounter: Payer: Self-pay | Admitting: Family Medicine

## 2019-05-03 ENCOUNTER — Telehealth (INDEPENDENT_AMBULATORY_CARE_PROVIDER_SITE_OTHER): Payer: BC Managed Care – PPO | Admitting: Family Medicine

## 2019-05-03 DIAGNOSIS — F419 Anxiety disorder, unspecified: Secondary | ICD-10-CM

## 2019-05-03 DIAGNOSIS — E785 Hyperlipidemia, unspecified: Secondary | ICD-10-CM

## 2019-05-03 DIAGNOSIS — R7301 Impaired fasting glucose: Secondary | ICD-10-CM | POA: Diagnosis not present

## 2019-05-03 DIAGNOSIS — G43909 Migraine, unspecified, not intractable, without status migrainosus: Secondary | ICD-10-CM | POA: Diagnosis not present

## 2019-05-03 NOTE — Progress Notes (Signed)
Virtual Visit via Video Note  I connected with Maria Gross  on 05/03/19 at  3:00 PM EDT by a video enabled telemedicine application and verified that I am speaking with the correct person using two identifiers.  Location patient: home Location provider:work or home office Persons participating in the virtual visit: patient, provider  I discussed the limitations of evaluation and management by telemedicine and the availability of in person appointments. The patient expressed understanding and agreed to proceed.  Unable to do a video meeting; so visit was conducted by phone.  Maria Gross DOB: 1961/07/26 Encounter date: 05/03/2019  This is a 58 y.o. female who presents with Chief Complaint  Patient presents with  . Follow-up    History of present illness: Taking B12 every other day. Feeling well. Husband is doing better, so this has been good.   Not eating or exercising differently, but has had increased stress with COVID/work. She craves sugar; but is satisfied with just a tiny amount of sugar (like Maria Gross).   Got flu shot Thursday.    Allergies  Allergen Reactions  . Ivp Dye [Iodinated Diagnostic Agents] Other (See Comments)    seizures   Current Meds  Medication Sig  . ALPRAZolam (XANAX) 1 MG tablet Take 0.5-1 tablets (0.5-1 mg total) by mouth at bedtime as needed for anxiety or sleep.  Marland Kitchen atorvastatin (LIPITOR) 40 MG tablet TAKE 1 TABLET(40 MG) BY MOUTH AT BEDTIME  . citalopram (CELEXA) 40 MG tablet TAKE 1 TABLET(40 MG) BY MOUTH AT BEDTIME  . Cyanocobalamin (VITAMIN B-12 PO) Take 1 tablet by mouth every other day.   . estradiol (VIVELLE-DOT) 0.0375 MG/24HR PLACE 1 PATCH ANYWHERE ON LOWER ABDOMEN AND CHANGE TWICE WEEKLY  . loratadine (CLARITIN) 10 MG tablet Take 10 mg by mouth daily.  . Multiple Vitamin (MULTIVITAMIN) capsule Take 1 capsule by mouth daily.  Marland Kitchen nystatin (MYCOSTATIN/NYSTOP) powder Apply topically 3 (three) times daily. Apply to affected area for  up to 7 days  . polycarbophil (FIBERCON) 625 MG tablet Take 625 mg by mouth daily.  Maria Gross Glycol-Propyl Glycol (SYSTANE) 0.4-0.3 % SOLN Place 1 drop into both eyes 2 (two) times daily.  . SUMAtriptan (IMITREX) 50 MG tablet TAKE 1 TABLET BY MOUTH DAILY AS NEEDED    Review of Systems  Constitutional: Negative for chills, fatigue and fever.  Respiratory: Negative for cough, chest tightness, shortness of breath and wheezing.   Cardiovascular: Negative for chest pain, palpitations and leg swelling.    Objective:  There were no vitals taken for this visit.      BP Readings from Last 3 Encounters:  09/14/18 122/78  05/20/18 120/64  05/13/18 110/70   Wt Readings from Last 3 Encounters:  09/14/18 175 lb 9.6 oz (79.7 kg)  05/20/18 175 lb 6.4 oz (79.6 kg)  05/13/18 173 lb (78.5 kg)    EXAM:  GENERAL: sounds alert, oriented, appears well and in no acute distress  LUNGS: breathing is comfortable over the phone.   PSYCH/NEURO: pleasant and cooperative, no obvious depression or anxiety, speech and thought processing grossly intact   Assessment/Plan  1. Hyperlipidemia, unspecified hyperlipidemia type Continue with lipitor.   2. Migraine without status migrainosus, not intractable, unspecified migraine type Stable. Sumatriptan prn.  3. Anxiety Stable on celexa.   4. Impaired fasting glucose Discussed low carb diet, getting regular exercise. Discussed eating healthy and fueling body for the day. More than half of visit was spent discussing diabetic diet and how to limit carbs with  meals, energize body with more protein/non starchy foods.    I discussed the assessment and treatment plan with the patient. The patient was provided an opportunity to ask questions and all were answered. The patient agreed with the plan and demonstrated an understanding of the instructions.   The patient was advised to call back or seek an in-person evaluation if the symptoms worsen or if the  condition fails to improve as anticipated.  I provided 30 minutes of non-face-to-face time during this encounter.   Maria Rough, MD

## 2019-05-04 ENCOUNTER — Telehealth: Payer: Self-pay | Admitting: *Deleted

## 2019-05-04 ENCOUNTER — Encounter: Payer: Self-pay | Admitting: Family Medicine

## 2019-05-04 DIAGNOSIS — F419 Anxiety disorder, unspecified: Secondary | ICD-10-CM | POA: Insufficient documentation

## 2019-05-04 DIAGNOSIS — G43909 Migraine, unspecified, not intractable, without status migrainosus: Secondary | ICD-10-CM | POA: Insufficient documentation

## 2019-05-04 DIAGNOSIS — E785 Hyperlipidemia, unspecified: Secondary | ICD-10-CM | POA: Insufficient documentation

## 2019-05-04 NOTE — Telephone Encounter (Signed)
Called the pt and scheduled an appt for 10/06/2019 and pt is aware the handout was mailed to the home address.

## 2019-05-04 NOTE — Telephone Encounter (Signed)
-----   Message from Caren Macadam, MD sent at 05/03/2019  3:33 PM EDT ----- Please set up physical in march. And please mail diabetic handout to her. Thanks!

## 2019-05-05 ENCOUNTER — Other Ambulatory Visit: Payer: Self-pay | Admitting: Obstetrics and Gynecology

## 2019-05-05 NOTE — Telephone Encounter (Signed)
Medication refill request: Vivelle Dot  Last AEX:  05/13/18 Next AEX:  07/04/19 Last MMG (if hormonal medication request): 01/13/18  Bi-rads 1 neg  Refill authorized:# 24 with 0 RF

## 2019-05-05 NOTE — Telephone Encounter (Signed)
I can refill patient's Vivelle Dot for one month at this time.   Please have patient update her mammogram if this has not already been done.  I can then refill her estrogen until her annual exam is due.

## 2019-05-11 ENCOUNTER — Other Ambulatory Visit: Payer: Self-pay | Admitting: Family Medicine

## 2019-05-15 ENCOUNTER — Ambulatory Visit: Payer: BLUE CROSS/BLUE SHIELD | Admitting: Obstetrics and Gynecology

## 2019-05-18 ENCOUNTER — Ambulatory Visit: Payer: BLUE CROSS/BLUE SHIELD | Admitting: Obstetrics and Gynecology

## 2019-05-28 ENCOUNTER — Other Ambulatory Visit: Payer: Self-pay | Admitting: Family Medicine

## 2019-05-28 DIAGNOSIS — G4700 Insomnia, unspecified: Secondary | ICD-10-CM

## 2019-06-01 DIAGNOSIS — Z1159 Encounter for screening for other viral diseases: Secondary | ICD-10-CM | POA: Diagnosis not present

## 2019-06-08 DIAGNOSIS — Z1159 Encounter for screening for other viral diseases: Secondary | ICD-10-CM | POA: Diagnosis not present

## 2019-06-12 DIAGNOSIS — Z1159 Encounter for screening for other viral diseases: Secondary | ICD-10-CM | POA: Diagnosis not present

## 2019-06-29 DIAGNOSIS — Z1159 Encounter for screening for other viral diseases: Secondary | ICD-10-CM | POA: Diagnosis not present

## 2019-07-03 DIAGNOSIS — Z1159 Encounter for screening for other viral diseases: Secondary | ICD-10-CM | POA: Diagnosis not present

## 2019-07-10 ENCOUNTER — Other Ambulatory Visit: Payer: Self-pay

## 2019-07-11 NOTE — Progress Notes (Signed)
58 y.o. [id number] Married Caucasian female here for annual exam.    She likes her vaginal estrogen patch.  She usually cuts the patch in half, but she did use a full patch for a few weeks when she had some stress when her husband was not feeling well. She noticed her breasts were more sensitive.   She has enough estrogen for 2 months.   Still with urinary incontinence.  Does not want tx at this time.  Has lost 8 pounds over 2 months.   Working for [employer].   PCP: Micheline Rough, MD   No LMP recorded. Patient has had a hysterectomy.           Sexually active: No. Husband had spinal cord injury 03/2018. The current method of family planning is status post hysterectomy--ovaries remain.    Exercising: Yes.     Smoker:  no  Health Maintenance: Pap: 05-13-18 Neg:Neg HR HPV, 03-19-17 Neg:Neg HR HPV, 02-21-16 Neg:Neg HR HPV History of abnormal Pap:  Yes, at 58y.o.had pre-cancerous cells on pap which lead to hysterectomy. MMG:01-13-18 3D/Neg/density B/BiRads1--patient knows to schedule Colonoscopy: 01-09-14 normal except for diverticulitis and internal hemorrhoids;next due 12/2023. BMD: 2010  Result :normal at [place]:  02-01-15 Gardasil:   no HIV:PCP Hep C:04-14-19 Neg Screening Labs:  PCP.  Flu vaccine:  Completed in October.    reports that she has never smoked. She has never used smokeless tobacco. She reports previous alcohol use. She reports that she does not use drugs.  Past Medical History:  Diagnosis Date  . Abnormal Pap smear of cervix    --age 37 had precancerous changes on pap and had hysterectomy  . Alopecia   . Anemia   . Anxiety   . Diverticulitis   . Endometriosis   . Family history of kidney cancer   . Family history of ovarian cancer   . Family history of prostate cancer   . Family history of uterine cancer   . High cholesterol   . Migraines   . Renal stones   . Uterine cancer ([place])    dx at 7    Past Surgical History:  Procedure Laterality Date   . ABDOMINAL HYSTERECTOMY     L-TAH--ovaries remain  . left kidney blockage Left    age [age], and age 34  . TONSILLECTOMY AND ADENOIDECTOMY     -age 20  . urethral stent Left    age 54    Current Outpatient Medications  Medication Sig Dispense Refill  . ALPRAZolam (XANAX) 1 MG tablet TAKE 1/2 TO 1 TABLET(0.5 TO 1 MG) BY MOUTH AT BEDTIME AS NEEDED FOR ANXIETY OR SLEEP 30 tablet 1  . atorvastatin (LIPITOR) 40 MG tablet TAKE 1 TABLET(40 MG) BY MOUTH AT BEDTIME 90 tablet 1  . citalopram (CELEXA) 40 MG tablet TAKE 1 TABLET(40 MG) BY MOUTH AT BEDTIME 90 tablet 1  . Cyanocobalamin (VITAMIN B-12 PO) Take 1 tablet by mouth every other day.     . estradiol (VIVELLE-DOT) 0.0375 MG/24HR UNWRAP AND APPLY 1 PATCH ON LOWER ABDOMEN AND CHANGE TWICE WEEKLY 8 patch 0  . loratadine (CLARITIN) 10 MG tablet Take 10 mg by mouth daily.    . Multiple Vitamin (MULTIVITAMIN) capsule Take 1 capsule by mouth daily.    Marland Kitchen nystatin (MYCOSTATIN/NYSTOP) powder Apply topically 3 (three) times daily. Apply to affected area for up to 7 days 30 g 2  . polycarbophil (FIBERCON) 625 MG tablet Take 625 mg by mouth daily.    Marland Kitchen  Polyethyl Glycol-Propyl Glycol (SYSTANE) 0.4-0.3 % SOLN Place 1 drop into both eyes 2 (two) times daily.    . SUMAtriptan (IMITREX) 50 MG tablet TAKE 1 TABLET BY MOUTH DAILY AS NEEDED 10 tablet 3   No current facility-administered medications for this visit.    Family History  Problem Relation Age of Onset  . Diabetes Mother 46  . Other Mother        passed away from a blood clot after sinus surgery  . Migraines Mother   . COPD Father 49  . Diabetes Father   . Diabetes Sister   . Cancer Maternal Grandfather 18       dec metastatic prostate ca  . Leukemia Paternal Aunt   . Cancer Cousin        uterine and ovarian  . Cancer Cousin        uterine and ovarian  . Kidney cancer Paternal Uncle        dx in his late 76s-30s  . Glomerulonephritis Paternal Uncle     Review of Systems  All other  systems reviewed and are negative.   Exam:   BP 122/80   Pulse 70   Temp (!) 97.1 F (36.2 C) (Temporal)   Resp 16   Ht 5\' 1"  (1.549 m)   Wt 172 lb (78 kg)   BMI 32.50 kg/m     General appearance: alert, cooperative and appears stated age Head: normocephalic, without obvious abnormality, atraumatic Neck: no adenopathy, supple, symmetrical, trachea midline and thyroid normal to inspection and palpation Lungs: clear to auscultation bilaterally Breasts: normal appearance, no masses or tenderness, No nipple retraction or dimpling, No nipple discharge or bleeding, No axillary adenopathy Heart: regular rate and rhythm Abdomen: soft, non-tender; no masses, no organomegaly Extremities: extremities normal, atraumatic, no cyanosis or edema Skin: skin color, texture, turgor normal. No rashes or lesions Lymph nodes: cervical, supraclavicular, and axillary nodes normal. Neurologic: grossly normal  Pelvic: External genitalia:  no lesions              No abnormal inguinal nodes palpated.              Urethra:  normal appearing urethra with no masses, tenderness or lesions              Bartholins and Skenes: normal                 Vagina: normal appearing vagina with normal color and discharge, no lesions.  Almost second degree rectocele.              Cervix:  absent              Pap taken: Yes.   Bimanual Exam:  Uterus: absent              Adnexa: no mass, fullness, tenderness              Rectal exam: Yes.  .  Confirms.              Anus:  normal sphincter tone, no lesions  Chaperone was present for exam.  Assessment:   Well woman visit with normal exam. Status post hysterectomy for cervical dysplasiaage 36.Ovaries remain.  Weight loss.  ERT.  GSI.  Rectocele. Alopecia. Paternal family with cancer history. Negative genetic testing.  Had POLE VUS.  Caregiver.   Plan: Mammogram screening discussed. Pap and HR HPV as above. Guidelines for Calcium, Vitamin D, regular exercise  program including cardiovascular and weight  bearing exercise. She will let me know when she needs a refill of her transdermal estrogen.  I reviewed WHI and risks of stroke, DVT, PE, and possible increased risk of breast cancer.  We discussed exercise for stress reduction instead of increasing her estrogen dosing.  Avoid straining with BMs to reduce worsening of rectocele. Follow up annually and prn.  After visit summary provided.

## 2019-07-12 ENCOUNTER — Encounter: Payer: Self-pay | Admitting: Obstetrics and Gynecology

## 2019-07-12 ENCOUNTER — Other Ambulatory Visit (HOSPITAL_COMMUNITY)
Admission: RE | Admit: 2019-07-12 | Discharge: 2019-07-12 | Disposition: A | Discharge status: Home / Self Care | Payer: BC Managed Care – PPO | Source: Ambulatory Visit | Attending: Obstetrics and Gynecology | Admitting: Obstetrics and Gynecology

## 2019-07-12 ENCOUNTER — Ambulatory Visit (INDEPENDENT_AMBULATORY_CARE_PROVIDER_SITE_OTHER): Payer: BC Managed Care – PPO | Admitting: Obstetrics and Gynecology

## 2019-07-12 ENCOUNTER — Other Ambulatory Visit: Payer: Self-pay

## 2019-07-12 VITALS — BP 122/80 | HR 70 | Temp 97.1°F | Resp 16 | Ht 61.0 in | Wt 172.0 lb

## 2019-07-12 DIAGNOSIS — Z01419 Encounter for gynecological examination (general) (routine) without abnormal findings: Secondary | ICD-10-CM

## 2019-07-12 DIAGNOSIS — Z87410 Personal history of cervical dysplasia: Secondary | ICD-10-CM | POA: Diagnosis not present

## 2019-07-12 NOTE — Patient Instructions (Signed)

## 2019-07-14 ENCOUNTER — Other Ambulatory Visit: Payer: Self-pay | Admitting: Obstetrics and Gynecology

## 2019-07-14 DIAGNOSIS — Z1231 Encounter for screening mammogram for malignant neoplasm of breast: Secondary | ICD-10-CM

## 2019-07-14 LAB — CYTOLOGY - PAP
Comment: NEGATIVE
Diagnosis: NEGATIVE
High risk HPV: NEGATIVE

## 2019-08-29 ENCOUNTER — Ambulatory Visit
Admission: RE | Admit: 2019-08-29 | Discharge: 2019-08-29 | Disposition: A | Discharge status: Home / Self Care | Payer: BC Managed Care – PPO | Source: Ambulatory Visit | Attending: Obstetrics and Gynecology | Admitting: Obstetrics and Gynecology

## 2019-08-29 ENCOUNTER — Other Ambulatory Visit: Payer: Self-pay

## 2019-08-29 ENCOUNTER — Encounter: Payer: Self-pay | Admitting: Obstetrics and Gynecology

## 2019-08-29 DIAGNOSIS — Z1231 Encounter for screening mammogram for malignant neoplasm of breast: Secondary | ICD-10-CM

## 2019-08-30 ENCOUNTER — Telehealth: Payer: Self-pay | Admitting: Obstetrics and Gynecology

## 2019-08-30 NOTE — Telephone Encounter (Signed)
Routing to Dr. Antony Blackbird.

## 2019-08-30 NOTE — Telephone Encounter (Signed)
Encounter reviewed and closed.  

## 2019-08-30 NOTE — Telephone Encounter (Signed)
Patient sent the following correspondence through [place].  Hi Dr. Quincy Simmonds, I thought about our conversation during my last visit and stopped the patches on 07/28/2019.  I have done well this past month.  Only experiencing some hot flashes but nothing severe.  I went for my mammogram today and you should have those results soon.  Thank you for taking time to discuss the side effects from the patches and if I don't need them any longer, I surely don't want to continue with them.  Hope you are doing well and take care. Maria Gross

## 2019-10-06 ENCOUNTER — Encounter: Payer: BC Managed Care – PPO | Admitting: Family Medicine

## 2019-11-03 ENCOUNTER — Telehealth: Payer: Self-pay | Admitting: Obstetrics and Gynecology

## 2019-11-03 NOTE — Telephone Encounter (Signed)
Left message for pt to return call to triage RN. 

## 2019-11-03 NOTE — Telephone Encounter (Signed)
Patient returning call.

## 2019-11-03 NOTE — Telephone Encounter (Signed)
Patient want to discuss procedure for prolapse.

## 2019-11-03 NOTE — Telephone Encounter (Signed)
Call to patient. Reports worsening prolapse symptoms and interested in treatment options. Has had episode of pain-resolved now, pelvic pressure, increased incontinence and wearing a pad. Appointment scheduled for 11-07-19 for evaluation and discuss options.  Routing to provider. Encounter closed.

## 2019-11-06 ENCOUNTER — Telehealth: Payer: Self-pay | Admitting: Obstetrics and Gynecology

## 2019-11-06 NOTE — Telephone Encounter (Signed)
Patient appointment was cancelled because provider is out of office and would like to be seen soon as possible because problem has gotten worse.

## 2019-11-06 NOTE — Telephone Encounter (Signed)
Spoke with pt. Pt reports having worsening prolapse since last talking with triage RN on 11/03/2019 and appt was cancelled today due to Dr Quincy Simmonds being out of office.   Last AEX 07/12/19. Hx of rectocele. Hx of urinary incontinence and wears pad.  Pt states having teledoc visit with PCP for UTI/Kidney sx on Saturday after having fever, chills, urinary pain, vaginal burning and pressure and loss of control of urine on Friday and Sat. Pt then received abx of Macrobid 100 mg BID x 5 days. Pt states fever, pain and chills are much better today with taking abx and ibuprofen 400-800mg  as needed. Pt to finish abx on Wed 11/08/19. Pt declines OV at this time with another provider since being given abx from PCP.  Pt requesting to have a consult to talk about having prolapse procedure/surgery after finishing abx with Dr Quincy Simmonds as soon as she can. Pt scheduled for OV with Dr Quincy Simmonds on 11/15/2019 at 2:30 pm per pt's request of date and time. Pt agreeable and verbalized understanding. Pt advised to call PCP or our office if sx don't completely resolve after finishing abx. Pt agreeable.   Routing to Dr Quincy Simmonds for review. Encounter closed.

## 2019-11-07 ENCOUNTER — Telehealth: Payer: Self-pay | Admitting: Obstetrics and Gynecology

## 2019-11-07 ENCOUNTER — Ambulatory Visit: Payer: BC Managed Care – PPO | Admitting: Obstetrics and Gynecology

## 2019-11-07 NOTE — Telephone Encounter (Signed)
Spoke with patient. See previous encounter dated 11/06/19. Patient will complete abx for UTI symptoms on 11/08/19. Patient reports she started feeling bad on Saturday, 11/04/19, had a teledoc visit. Patient reports symptoms are not worse, symptoms have not resolved. Has reduced her ibuprofen intake, reports pain in left flank pain still present. Denies fever. Chills present, but "improved". Patient reports prolapse has gotten worse since her last visit in 06/2019, was scheduled for consult with Dr. Quincy Simmonds to further discuss treatment options/surgery. Does not feel she can wait 3wks now.   Patient asking if she schedules with another provider, will she need to return to see Dr. Quincy Simmonds again prior to surgery?   Advised patient OV recommended now for further evaluation of UTI symptoms, strongly encouraged patient not to wait until next week if symptoms are not better or resolved. Advised since she has not been seen and no urine culture completed, may need change in abx. Advised patient Dr. Quincy Simmonds would be the provider that she would ultimately see for surgery, covering provider can evaluate and make recommendations until Dr. Quincy Simmonds returns. Patient declines OV for today or tomorrow, patient is adamant the earliest she can come is Thursday. OV scheduled for 4/15 at 8:30am with Dr. Sabra Heck. ER/Urgent care precautions provided for new or worsening symptoms. Advised patient I will update Dr. Sabra Heck and return call if any additional recommendations. Patient verbalizes understanding.    Routing to Dr. Sabra Heck for final review.

## 2019-11-07 NOTE — Telephone Encounter (Signed)
Agree with recommendations.  Ok to close encounter.

## 2019-11-07 NOTE — Telephone Encounter (Signed)
Encounter closed

## 2019-11-07 NOTE — Telephone Encounter (Signed)
Left message to call Dama Hedgepeth, RN at [facility] [phone].   

## 2019-11-07 NOTE — Telephone Encounter (Signed)
Patient appointment cancelled with Dr Quincy Simmonds and she would like to speak with nurse to get in soon as possible.

## 2019-11-08 ENCOUNTER — Other Ambulatory Visit: Payer: Self-pay

## 2019-11-08 NOTE — Progress Notes (Signed)
GYNECOLOGY  VISIT  CC:   Pelvic pain  HPI: 59 y.o. G50P1001 Married White or Caucasian female here for pelvic pain that she is concerned is related to worsening pelvic prolapse.  Pt has hx of hysterectomy at age 24 due to abnormal cells.  She does have a hx of cystocele, second degree, noted in Dr. Elza Rafter note from December, 2021.  Pt reports she felt she had a UTI and called a tele doc with her work and was treated with macrobid.  She is having urinary urgency and frequency that has now worsened to a lot of pressure, pelvic pain and mid back pain, L>R.  She is having some chills and feels clammy.  Denies fever.  Denies vaginal bleeding or discharge.  Is pushing as much fluids as she can.  Urine micro today with WBCs in it.  Feels the antibiotic helped some but never really cleared it and it has gradually worsened.  But she was told by Dr. Quincy Simmonds that "she would know" when it is time for surgery so she wonders if the time is now.    GYNECOLOGIC HISTORY: No LMP recorded. Patient has had a hysterectomy. Contraception: hysterectomy - ovaries remain Menopausal hormone therapy: Vivelle-Dot Poct urine-trace WBC  Patient Active Problem List   Diagnosis Date Noted  . Hyperlipidemia 05/04/2019  . Migraines   . Anxiety   . Genetic testing 05/26/2017  . Family history of uterine cancer   . Family history of ovarian cancer   . Family history of kidney cancer   . Family history of prostate cancer   . Uterine cancer [facility])     Past Medical History:  Diagnosis Date  . Abnormal Pap smear of cervix    --age 31 had precancerous changes on pap and had hysterectomy  . Alopecia   . Anemia   . Anxiety   . Diverticulitis   . Endometriosis   . Family history of kidney cancer   . Family history of ovarian cancer   . Family history of prostate cancer   . Family history of uterine cancer   . High cholesterol   . Migraines   . Renal stones   . Uterine cancer ([place])    dx at 59    Past Surgical  History:  Procedure Laterality Date  . ABDOMINAL HYSTERECTOMY     L-TAH--ovaries remain  . left kidney blockage Left    age [age], and age 60  . TONSILLECTOMY AND ADENOIDECTOMY     -age 46  . urethral stent Left    age 64    MEDS:   Current Outpatient Medications on File Prior to Visit  Medication Sig Dispense Refill  . Cyanocobalamin (VITAMIN B-12 PO) Take 1 tablet by mouth every other day.     . diphenhydrAMINE HCl (BENADRYL PO) Take by mouth.    . IBUPROFEN PO Take by mouth.    . loratadine (CLARITIN) 10 MG tablet Take 10 mg by mouth daily.    Marland Kitchen MELATONIN PO Take by mouth.    . Multiple Vitamin (MULTIVITAMIN) capsule Take 1 capsule by mouth daily.    Marland Kitchen nystatin (MYCOSTATIN/NYSTOP) powder Apply topically 3 (three) times daily. Apply to affected area for up to 7 days 30 g 2  . polycarbophil (FIBERCON) 625 MG tablet Take 625 mg by mouth daily.    Vladimir Faster Glycol-Propyl Glycol (SYSTANE) 0.4-0.3 % SOLN Place 1 drop into both eyes 2 (two) times daily.    . SUMAtriptan (IMITREX) 50 MG tablet TAKE  1 TABLET BY MOUTH DAILY AS NEEDED 10 tablet 3   No current facility-administered medications on file prior to visit.    ALLERGIES: Ivp dye [iodinated diagnostic agents]  Family History  Problem Relation Age of Onset  . Diabetes Mother 8  . Other Mother        passed away from a blood clot after sinus surgery  . Migraines Mother   . COPD Father 108  . Diabetes Father   . Diabetes Sister   . Cancer Maternal Grandfather 51       dec metastatic prostate ca  . Leukemia Paternal Aunt   . Cancer Cousin        uterine and ovarian  . Cancer Cousin        uterine and ovarian  . Kidney cancer Paternal Uncle        dx in his late 39s-30s  . Glomerulonephritis Paternal Uncle     SH:  Married, non smoker  Review of Systems  Constitutional: Negative.   HENT: Negative.   Eyes: Negative.   Respiratory: Negative.   Cardiovascular: Negative.   Gastrointestinal: Negative.   Endocrine:  Negative.   Genitourinary: Negative.        Bladder prolapse, back pain, pelvic pain  Musculoskeletal: Negative.   Skin: Negative.   Allergic/Immunologic: Negative.   Neurological: Negative.   Psychiatric/Behavioral: Negative.     PHYSICAL EXAMINATION:    BP 114/70   Pulse 68   Temp 97.9 F (36.6 C) (Skin)   Resp 16   Wt 171 lb (77.6 kg)   BMI 32.31 kg/m     General appearance: alert, cooperative and appears stated age CV:  Regular rate and rhythm Lungs:  clear to auscultation, no wheezes, rales or rhonchi, symmetric air entry Flank:  Left CVA tenderness with splinting Abdomen: soft, suprepubic tenderness; bowel sounds normal; no masses,  no organomegaly Lymph:  no inguinal LAD noted  Pelvic: External genitalia:  no lesions              Urethra:  normal appearing urethra with no masses, tenderness or lesions              Bartholins and Skenes: normal                 Vagina: normal appearing vagina with normal color and discharge, no lesions, 2nd degree cystocele note, 2nd degree rectocele              Cervix: absent              Bimanual Exam:  Uterus:  uterus absent              Adnexa: no mass, fullness, tenderness  Chaperone, Marisa Sprinkles, CMA, was present for exam.  Assessment: Probable untreated UTI now complicated with physical exam findings concerning for pyelonephritis with flank pain Possible renal stone but no RBCs in urine today so this is less likely Cystocele and rectocele that does not seem significantly changed from prior exam in December with Dr. Quincy Simmonds  Plan: Urine culture pending CBC, stat, obtained today Rocephin 1 gram IM x 1 today.  Bactim DS BID x 7 days.  She will start in 24 hours. Pyridium 100mg  tid up to 5 days as needed Advised would like recheck tomorrow but pt feels she cannot come in tomorrow.  Promises to call in the AM with update.

## 2019-11-09 ENCOUNTER — Other Ambulatory Visit: Payer: Self-pay | Admitting: Family Medicine

## 2019-11-09 ENCOUNTER — Other Ambulatory Visit: Payer: Self-pay

## 2019-11-09 ENCOUNTER — Ambulatory Visit: Payer: BC Managed Care – PPO | Admitting: Obstetrics & Gynecology

## 2019-11-09 ENCOUNTER — Encounter: Payer: Self-pay | Admitting: Obstetrics & Gynecology

## 2019-11-09 VITALS — BP 114/70 | HR 68 | Temp 97.9°F | Resp 16 | Wt 171.0 lb

## 2019-11-09 DIAGNOSIS — N390 Urinary tract infection, site not specified: Secondary | ICD-10-CM

## 2019-11-09 DIAGNOSIS — N12 Tubulo-interstitial nephritis, not specified as acute or chronic: Secondary | ICD-10-CM | POA: Diagnosis not present

## 2019-11-09 LAB — POCT URINALYSIS DIPSTICK
Bilirubin, UA: NEGATIVE
Blood, UA: NEGATIVE
Glucose, UA: NEGATIVE
Ketones, UA: NEGATIVE
Nitrite, UA: NEGATIVE
Protein, UA: NEGATIVE
Urobilinogen, UA: NEGATIVE E.U./dL — AB
pH, UA: 5 (ref 5.0–8.0)

## 2019-11-09 LAB — CBC
Hematocrit: 36.5 % (ref 34.0–46.6)
Hemoglobin: 12.2 g/dL (ref 11.1–15.9)
MCH: 30.2 pg (ref 26.6–33.0)
MCHC: 33.4 g/dL (ref 31.5–35.7)
MCV: 90 fL (ref 79–97)
Platelets: 227 10*3/uL (ref 150–450)
RBC: 4.04 x10E6/uL (ref 3.77–5.28)
RDW: 13.1 % (ref 11.7–15.4)
WBC: 4.8 10*3/uL (ref 3.4–10.8)

## 2019-11-09 MED ORDER — SULFAMETHOXAZOLE-TRIMETHOPRIM 400-80 MG PO TABS: 1.0000 | ORAL_TABLET | Freq: Two times a day (BID) | ORAL | 0 refills | Status: DC

## 2019-11-09 MED ORDER — CEFTRIAXONE SODIUM 1 G IJ SOLR
1.0000 g | Freq: Once | INTRAMUSCULAR | Status: AC
  Administered 2019-11-09: 1 g via INTRAMUSCULAR

## 2019-11-09 MED ORDER — CEFTRIAXONE SODIUM 1 G IJ SOLR: 1.0000 g | Freq: Once | INTRAMUSCULAR | 0 refills | Status: DC

## 2019-11-09 MED ORDER — CEFTRIAXONE SODIUM 250 MG IJ SOLR: 1000.0000 mg | Freq: Once | INTRAMUSCULAR | Status: DC

## 2019-11-09 MED ORDER — PHENAZOPYRIDINE HCL 100 MG PO TABS: 100.0000 mg | ORAL_TABLET | Freq: Three times a day (TID) | ORAL | 0 refills | Status: DC | PRN

## 2019-11-10 ENCOUNTER — Telehealth: Payer: Self-pay

## 2019-11-10 NOTE — Telephone Encounter (Signed)
Spoke with Dr Sabra Heck. Dr Sabra Heck recommends pt going to ER for CT scan since having no relief in pain or sx.  Call placed to pt. Spoke with pt. Pt states " I am not going to ER and spend all day" States she will drink lots of water and finish taking Bactrim Rx and will call back to office on Monday and give Dr Sabra Heck an update. ER precautions given. Pt agreeable.

## 2019-11-10 NOTE — Telephone Encounter (Signed)
Spoke with pt. Pt calling to give update to Dr Sabra Heck. Pt states not feeling any better, still having pain/pressure in abd/pelvis 9/10 on pain scale. Pt also has nausea with off and on chills. Pt has not taken temperature. Denies vomiting, vag bleeding or discharge. Pt going to try and eat breakfast and take bactrim Rx. Will update Dr Sabra Heck and return call to pt with recommendations. Pt agreeable.

## 2019-11-10 NOTE — Telephone Encounter (Signed)
Patient called regarding visit yesterday (11/09/19), patient was told to call with update on how she was feeling. Patient stated she could not go to work today and is not feeling any better. Patient took one dose of medication yesterday but cannot take the second one today yet due to nausea.

## 2019-11-10 NOTE — Telephone Encounter (Signed)
Call to patient. Reviewed recommendation for evaluation in ED.  Advised with continued symptoms, pain and no improvement since medication, recommend further evaluation, including kidney stone or even other cause.   Patient states ate banana & coffee and is resting. Still has nausea which is common for her with UTI.  She states she will monitor and agrees to go to ED if does not start to improve or if any worsening symptoms.   Patient to call Monday with update.   Routing to Dr Sabra Heck.

## 2019-11-11 LAB — URINE CULTURE

## 2019-11-13 ENCOUNTER — Telehealth: Payer: Self-pay | Admitting: *Deleted

## 2019-11-13 NOTE — Telephone Encounter (Signed)
Call to patient. Voice mail has name confirmation. Per DPR, can leave message on voice mail.  Left message calling for status update.  Also left message that urine culture was negative; pain not related to UTI and need to see how she is feeling. Call back and ask for triage nurse.

## 2019-11-13 NOTE — Telephone Encounter (Signed)
-----   Message from Megan Salon, MD sent at 11/12/2019  5:05 AM EDT ----- Please call pt and advise urine culture was negative.  CBC was normal as well.  She was advised twice on Friday to be seen in the ER and I've checked several times and she has not gone as of Sunday morning.  Please get update from pt.  Thanks you.

## 2019-11-13 NOTE — Telephone Encounter (Signed)
Call back to patient, again, voice mail has name confirmation. Left message ( per DPR) that we are glad she is feeling better but that urine culture was negative for infection. If has return of symptoms should see PCP, urology or urgent care for evaluation.   Routing to Dr Sabra Heck, Juluis Rainier. Encounter closed.

## 2019-11-13 NOTE — Telephone Encounter (Signed)
Patient returned call to triage. She said "I am doing much better and the medication did help". No need to return call to patient.

## 2019-11-15 ENCOUNTER — Ambulatory Visit: Payer: Self-pay | Admitting: Obstetrics and Gynecology

## 2019-11-20 ENCOUNTER — Other Ambulatory Visit: Payer: Self-pay

## 2019-11-20 NOTE — Progress Notes (Signed)
GYNECOLOGY  VISIT   HPI: 59 y.o.   Married  Caucasian  female   G1P1001 with No LMP recorded. Patient has had a hysterectomy.   here for follow up. Patient states she is feeling better, but the last 2 nights complaint of pelvic disomfort. She states Ibuprofen helps.  On April 5, she had a episode of urinary incontinence and pain.  She feels like something shifted and dropped at that moment.  She feels like her rectal prolapse is worse.   She was treated by a teledoc for a UTI and took Macrobid. Patient treated for complicated UTI with Rocephin 1 gram and Bactrim DS bid x 7 days after seen by Dr. Sabra Heck on 11/09/19.  Her final UC was negative for infection.   When she tries to void, she feels like it does not all come out.  She did have pain with urination, but this is better now.  No hematuria.   Feels midline pelvic pressure. LLQ pain wakes her up at night.  BMs are different.  Now she is having hesitancy to have a BM.  She is having rectal pain.  She is considering rectocele surgery.  Hx diverticulitis.   Standing at work now, but this is not new.   She stopped her estrogen therapy, Celexa, and Lorazepam in January, 2021.   Not sexually active.   GYNECOLOGIC HISTORY: No LMP recorded. Patient has had a hysterectomy. Contraception: Hyst--ovaries remain Menopausal hormone therapy: No Last mammogram: 08-29-19 3D/Neg/density B/BiRads1 Last pap smear: 07-12-19 Neg, 05-13-18 Neg:Neg HR HPv, 03-19-17 Neg:Neg HR HPV        OB History    Gravida  1   Para  1   Term  1   Preterm      AB      Living  1     SAB      TAB      Ectopic      Multiple      Live Births                 Patient Active Problem List   Diagnosis Date Noted  . Hyperlipidemia 05/04/2019  . Migraines   . Anxiety   . Genetic testing 05/26/2017  . Family history of uterine cancer   . Family history of ovarian cancer   . Family history of kidney cancer   . Family history of prostate  cancer   . Uterine cancer [facility])     Past Medical History:  Diagnosis Date  . Abnormal Pap smear of cervix    --age 64 had precancerous changes on pap and had hysterectomy  . Alopecia   . Anemia   . Anxiety   . Diverticulitis   . Endometriosis   . Family history of kidney cancer   . Family history of ovarian cancer   . Family history of prostate cancer   . Family history of uterine cancer   . High cholesterol   . Migraines   . Renal stones   . Uterine cancer ([place])    dx at 43    Past Surgical History:  Procedure Laterality Date  . ABDOMINAL HYSTERECTOMY     L-TAH--ovaries remain  . left kidney blockage Left    age [age], and age 26  . TONSILLECTOMY AND ADENOIDECTOMY     -age 67  . urethral stent Left    age 4    Current Outpatient Medications  Medication Sig Dispense Refill  . atorvastatin (LIPITOR) 40 MG  tablet TAKE 1 TABLET(40 MG) BY MOUTH AT BEDTIME 90 tablet 1  . Cyanocobalamin (VITAMIN B-12 PO) Take 1 tablet by mouth every other day.     . diphenhydrAMINE HCl (BENADRYL PO) Take by mouth.    . IBUPROFEN PO Take by mouth.    . loratadine (CLARITIN) 10 MG tablet Take 10 mg by mouth daily.    Marland Kitchen MELATONIN PO Take by mouth.    . Multiple Vitamin (MULTIVITAMIN) capsule Take 1 capsule by mouth daily.    Marland Kitchen nystatin (MYCOSTATIN/NYSTOP) powder Apply topically 3 (three) times daily. Apply to affected area for up to 7 days 30 g 2  . polycarbophil (FIBERCON) 625 MG tablet Take 625 mg by mouth daily.    Vladimir Faster Glycol-Propyl Glycol (SYSTANE) 0.4-0.3 % SOLN Place 1 drop into both eyes 2 (two) times daily.    . SUMAtriptan (IMITREX) 50 MG tablet TAKE 1 TABLET BY MOUTH DAILY AS NEEDED 10 tablet 3   No current facility-administered medications for this visit.     ALLERGIES: Ivp dye [iodinated diagnostic agents]  Family History  Problem Relation Age of Onset  . Diabetes Mother 21  . Other Mother        passed away from a blood clot after sinus surgery  . Migraines Mother    . COPD Father 69  . Diabetes Father   . Diabetes Sister   . Cancer Maternal Grandfather 98       dec metastatic prostate ca  . Leukemia Paternal Aunt   . Cancer Cousin        uterine and ovarian  . Cancer Cousin        uterine and ovarian  . Kidney cancer Paternal Uncle        dx in his late 27s-30s  . Glomerulonephritis Paternal Uncle     Social History   Socioeconomic History  . Marital status: Married    Spouse name: Not on file  . Number of children: Not on file  . Years of education: Not on file  . Highest education level: Not on file  Occupational History  . Not on file  Tobacco Use  . Smoking status: Never Smoker  . Smokeless tobacco: Never Used  Substance and Sexual Activity  . Alcohol use: Not Currently    Alcohol/week: 0.0 standard drinks  . Drug use: No  . Sexual activity: Not Currently    Partners: Male    Comment: Hyst  Other Topics Concern  . Not on file  Social History Narrative  . Not on file   Social Determinants of Health   Financial Resource Strain:   . Difficulty of Paying Living Expenses:   Food Insecurity:   . Worried About Charity fundraiser in the Last Year:   . Arboriculturist in the Last Year:   Transportation Needs:   . Film/video editor (Medical):   Marland Kitchen Lack of Transportation (Non-Medical):   Physical Activity:   . Days of Exercise per Week:   . Minutes of Exercise per Session:   Stress:   . Feeling of Stress :   Social Connections:   . Frequency of Communication with Friends and Family:   . Frequency of Social Gatherings with Friends and Family:   . Attends Religious Services:   . Active Member of Clubs or Organizations:   . Attends Archivist Meetings:   Marland Kitchen Marital Status:   Intimate Partner Violence:   . Fear of Current or Ex-Partner:   .  Emotionally Abused:   Marland Kitchen Physically Abused:   . Sexually Abused:     Review of Systems  Genitourinary: Positive for pelvic pain.  All other systems reviewed and are  negative.   PHYSICAL EXAMINATION:    BP 126/82 (Cuff Size: Large)   Pulse 80   Temp (!) 97.2 F (36.2 C) (Temporal)   Ht 5\' 2"  (1.575 m)   Wt 168 lb 6.4 oz (76.4 kg)   BMI 30.80 kg/m     General appearance: alert, cooperative and appears stated age  Abdomen: soft, non-tender, no masses,  no organomegaly Back:  Negative CVA tenderness.  Extremities: extremities normal, atraumatic, no cyanosis or edema Skin: Skin color, texture, turgor normal. No rashes or lesions No abnormal inguinal nodes palpated Neurologic: Grossly normal  Pelvic: External genitalia:  no lesions              Urethra:  normal appearing urethra with no masses, tenderness or lesions              Bartholins and Skenes: normal                 Vagina: normal appearing vagina with normal color and discharge, no lesions.  Second degree rectocele.              Cervix: absent.                Bimanual Exam:  Uterus:  Absent.              Adnexa: no mass, fullness, tenderness              Rectal exam: Yes.  .  Confirms.              Anus:  normal sphincter tone, no lesions  Chaperone was present for exam.  ASSESSMENT  Status post hysterectomy.  Ovaries remain. Known rectocele.  LLQ pain.  Possible recurrent diverticulitis.   PLAN  We discussed rectocele repair, pessary and pelvic floor PT.  Will proceed with CT scan of abdomen and pelvis. CBC with differential and CMP.   Will need to schedule a return visit to reassess her rectocele and care for this.    An After Visit Summary was printed and given to the patient.

## 2019-11-21 ENCOUNTER — Other Ambulatory Visit: Payer: Self-pay

## 2019-11-21 ENCOUNTER — Other Ambulatory Visit: Payer: Self-pay | Admitting: Obstetrics and Gynecology

## 2019-11-21 ENCOUNTER — Ambulatory Visit: Payer: BC Managed Care – PPO | Admitting: Obstetrics and Gynecology

## 2019-11-21 ENCOUNTER — Encounter: Payer: Self-pay | Admitting: Obstetrics and Gynecology

## 2019-11-21 VITALS — BP 126/82 | HR 80 | Temp 97.2°F | Ht 62.0 in | Wt 168.4 lb

## 2019-11-21 DIAGNOSIS — R1032 Left lower quadrant pain: Secondary | ICD-10-CM | POA: Diagnosis not present

## 2019-11-21 DIAGNOSIS — Z8719 Personal history of other diseases of the digestive system: Secondary | ICD-10-CM

## 2019-11-21 DIAGNOSIS — N816 Rectocele: Secondary | ICD-10-CM

## 2019-11-21 NOTE — Progress Notes (Signed)
CT abd/pelvis without contrast orders placed per Dr Quincy Simmonds.  Cc: Hayley for precert  Encounter closed.

## 2019-11-22 ENCOUNTER — Telehealth: Payer: Self-pay

## 2019-11-22 ENCOUNTER — Ambulatory Visit
Admission: RE | Admit: 2019-11-22 | Discharge: 2019-11-22 | Disposition: A | Discharge status: Home / Self Care | Payer: BC Managed Care – PPO | Source: Ambulatory Visit | Attending: Obstetrics and Gynecology | Admitting: Obstetrics and Gynecology

## 2019-11-22 DIAGNOSIS — R1032 Left lower quadrant pain: Secondary | ICD-10-CM

## 2019-11-22 DIAGNOSIS — Z8719 Personal history of other diseases of the digestive system: Secondary | ICD-10-CM

## 2019-11-22 LAB — COMPREHENSIVE METABOLIC PANEL
ALT: 25 IU/L (ref 0–32)
AST: 22 IU/L (ref 0–40)
Albumin/Globulin Ratio: 2.1 (ref 1.2–2.2)
Albumin: 4.8 g/dL (ref 3.8–4.9)
Alkaline Phosphatase: 68 IU/L (ref 39–117)
BUN/Creatinine Ratio: 22 (ref 9–23)
BUN: 16 mg/dL (ref 6–24)
Bilirubin Total: 0.7 mg/dL (ref 0.0–1.2)
CO2: 25 mmol/L (ref 20–29)
Calcium: 9.4 mg/dL (ref 8.7–10.2)
Chloride: 102 mmol/L (ref 96–106)
Creatinine, Ser: 0.73 mg/dL (ref 0.57–1.00)
GFR calc Af Amer: 105 mL/min/{1.73_m2} (ref >59)
GFR calc non Af Amer: 91 mL/min/{1.73_m2} (ref >59)
Globulin, Total: 2.3 g/dL (ref 1.5–4.5)
Glucose: 87 mg/dL (ref 65–99)
Potassium: 4 mmol/L (ref 3.5–5.2)
Sodium: 143 mmol/L (ref 134–144)
Total Protein: 7.1 g/dL (ref 6.0–8.5)

## 2019-11-22 LAB — CBC WITH DIFFERENTIAL/PLATELET
Basophils Absolute: 0 10*3/uL (ref 0.0–0.2)
Basos: 0 %
EOS (ABSOLUTE): 0.1 10*3/uL (ref 0.0–0.4)
Eos: 1 %
Hematocrit: 39.4 % (ref 34.0–46.6)
Hemoglobin: 13.1 g/dL (ref 11.1–15.9)
Immature Grans (Abs): 0 10*3/uL (ref 0.0–0.1)
Immature Granulocytes: 0 %
Lymphocytes Absolute: 2.6 10*3/uL (ref 0.7–3.1)
Lymphs: 28 %
MCH: 30.2 pg (ref 26.6–33.0)
MCHC: 33.2 g/dL (ref 31.5–35.7)
MCV: 91 fL (ref 79–97)
Monocytes Absolute: 0.6 10*3/uL (ref 0.1–0.9)
Monocytes: 6 %
Neutrophils Absolute: 5.7 10*3/uL (ref 1.4–7.0)
Neutrophils: 65 %
Platelets: 270 10*3/uL (ref 150–450)
RBC: 4.34 x10E6/uL (ref 3.77–5.28)
RDW: 12.6 % (ref 11.7–15.4)
WBC: 9.1 10*3/uL (ref 3.4–10.8)

## 2019-11-22 NOTE — Addendum Note (Signed)
Addended by: Georgia Lopes on: 11/22/2019 09:08 AM   Modules accepted: Orders

## 2019-11-22 NOTE — Telephone Encounter (Signed)
Call to pt. Spoke with pt.    Pt scheduled for CT abd/pelvis wo contrast at [address] at [facility] today at 9:45 am. Pt agreeable and will go now. Verbalized understanding.   Routing to Dr Quincy Simmonds for review. Encounter closed.

## 2019-11-22 NOTE — Telephone Encounter (Signed)
-----   Message from Nunzio Cobbs, MD sent at 11/22/2019  1:16 PM EDT ----- Please contact patient with results of her CT scan which shows stool in the colon and no evidence of diverticulitis.   I recommend she take a Senna S daily to see if this helps improve her bowel function and the left lower quadrant pain.   I am happy to see her back in about a week to see if she is feeling better and wishes to pursue any treatment for her rectocele.   She can be removed from imaging hold.

## 2019-11-22 NOTE — Telephone Encounter (Signed)
Medication refill request: Nystatin powder  Last AEX:  07-12-2019 BS Next AEX: 08-05-20 Last MMG (if hormonal medication request): n/a Refill authorized: today, please advise.   Medication pended for #30g, 0RF. Please refill if appropriate.

## 2019-11-22 NOTE — Telephone Encounter (Signed)
Spoke with pt. Pt given results and recommendations per Dr Quincy Simmonds. Pt agreeable and verbalized understanding.  Pt would like have follow up appt with Dr Quincy Simmonds. Pt scheduled for 11/28/19 at 3pm. Pt verbalized understanding of date and time of appt.   Routing to Dr Quincy Simmonds for review  Encounter closed.

## 2019-11-27 ENCOUNTER — Other Ambulatory Visit: Payer: Self-pay

## 2019-11-28 ENCOUNTER — Ambulatory Visit: Payer: BC Managed Care – PPO | Admitting: Obstetrics and Gynecology

## 2019-11-28 ENCOUNTER — Encounter: Payer: Self-pay | Admitting: Obstetrics and Gynecology

## 2019-11-28 VITALS — BP 110/68 | HR 76 | Temp 97.3°F | Ht 62.0 in | Wt 168.0 lb

## 2019-11-28 DIAGNOSIS — N816 Rectocele: Secondary | ICD-10-CM

## 2019-11-28 DIAGNOSIS — K5900 Constipation, unspecified: Secondary | ICD-10-CM | POA: Diagnosis not present

## 2019-11-28 NOTE — Patient Instructions (Signed)

## 2019-11-28 NOTE — Progress Notes (Signed)
GYNECOLOGY  VISIT   HPI: 59 y.o.   Married  Caucasian  female   G1P1001 with No LMP recorded. Patient has had a hysterectomy.   here for follow up from CT scan done due to LLQ pain.   CLINICAL DATA:  Left lower quadrant pain for 3 weeks, finished antibiotics. History of diverticulitis.  EXAM: CT ABDOMEN AND PELVIS WITHOUT CONTRAST  TECHNIQUE: Multidetector CT imaging of the abdomen and pelvis was performed following the standard protocol without IV contrast.  COMPARISON:  12/26/2013.  FINDINGS: Lower chest: Lung bases are clear. Heart size normal. No pericardial or pleural effusion. Distal esophagus is grossly unremarkable.  Hepatobiliary: Liver and gallbladder are unremarkable. No biliary ductal dilatation.  Pancreas: Negative.  Spleen: Negative.  Adrenals/Urinary Tract: Adrenal glands and kidneys are unremarkable. Ureters are decompressed. Bladder is grossly unremarkable.  Stomach/Bowel: Stomach, small bowel, appendix and colon are unremarkable. Of note, there is a fair amount of stool in the colon, indicative of constipation.  Vascular/Lymphatic: Atherosclerotic calcification of the aorta without aneurysm. No pathologically enlarged lymph nodes.  Reproductive: Hysterectomy.  No adnexal mass.  Other: No free fluid.  Mesenteries and peritoneum are unremarkable.  Musculoskeletal: No worrisome lytic or sclerotic lesions.  IMPRESSION: 1. No acute findings to explain the patient's left lower quadrant pain specifically, no evidence of diverticulitis. 2. Fair amount of stool in the colon is indicative of constipation. 3.  Aortic atherosclerosis (ICD10-I70.0).   Electronically Signed   By: Lorin Picket M.D.   On: 11/22/2019 11:22  LLQ pain improved since stopping ibuprofen. Constipation is less.  She is using a stool softener. She is now sitting at work instead of standing.   She wants to be fitted for a pessary today to help treat her  rectocele.   GYNECOLOGIC HISTORY: No LMP recorded. Patient has had a hysterectomy. Contraception:  Hysterectomy -- ovaries remain Menopausal hormone therapy:  None Last mammogram:  08-29-19 3D/Neg/density B/BiRads1 Last pap smear:   07-12-19 Neg, 05-13-18 Neg:Neg HR HPv, 03-19-17 Neg:Neg HR HPV        OB History    Gravida  1   Para  1   Term  1   Preterm      AB      Living  1     SAB      TAB      Ectopic      Multiple      Live Births                 Patient Active Problem List   Diagnosis Date Noted  . Hyperlipidemia 05/04/2019  . Migraines   . Anxiety   . Genetic testing 05/26/2017  . Family history of uterine cancer   . Family history of ovarian cancer   . Family history of kidney cancer   . Family history of prostate cancer   . Uterine cancer [facility])     Past Medical History:  Diagnosis Date  . Abnormal Pap smear of cervix    --age 28 had precancerous changes on pap and had hysterectomy  . Alopecia   . Anemia   . Anxiety   . Diverticulitis   . Endometriosis   . Family history of kidney cancer   . Family history of ovarian cancer   . Family history of prostate cancer   . Family history of uterine cancer   . High cholesterol   . Migraines   . Renal stones   . Uterine cancer ([place])  dx at 89    Past Surgical History:  Procedure Laterality Date  . ABDOMINAL HYSTERECTOMY     L-TAH--ovaries remain  . left kidney blockage Left    age [age], and age 63  . TONSILLECTOMY AND ADENOIDECTOMY     -age 89  . urethral stent Left    age 6    Current Outpatient Medications  Medication Sig Dispense Refill  . atorvastatin (LIPITOR) 40 MG tablet TAKE 1 TABLET(40 MG) BY MOUTH AT BEDTIME 90 tablet 1  . Cyanocobalamin (VITAMIN B-12 PO) Take 1 tablet by mouth every other day.     . diphenhydrAMINE HCl (BENADRYL PO) Take by mouth.    . IBUPROFEN PO Take by mouth.    . loratadine (CLARITIN) 10 MG tablet Take 10 mg by mouth daily.    Marland Kitchen MELATONIN PO Take  by mouth.    . Multiple Vitamin (MULTIVITAMIN) capsule Take 1 capsule by mouth daily.    . NYSTATIN powder APPLY TOPICALLY TO THE AFFECTED AREA THREE TIMES DAILY FOR UP TO 7 DAYS 30 g 0  . polycarbophil (FIBERCON) 625 MG tablet Take 625 mg by mouth daily.    Vladimir Faster Glycol-Propyl Glycol (SYSTANE) 0.4-0.3 % SOLN Place 1 drop into both eyes 2 (two) times daily.    . SUMAtriptan (IMITREX) 50 MG tablet TAKE 1 TABLET BY MOUTH DAILY AS NEEDED 10 tablet 3   No current facility-administered medications for this visit.     ALLERGIES: Ivp dye [iodinated diagnostic agents]  Family History  Problem Relation Age of Onset  . Diabetes Mother 70  . Other Mother        passed away from a blood clot after sinus surgery  . Migraines Mother   . COPD Father 63  . Diabetes Father   . Diabetes Sister   . Cancer Maternal Grandfather 17       dec metastatic prostate ca  . Leukemia Paternal Aunt   . Cancer Cousin        uterine and ovarian  . Cancer Cousin        uterine and ovarian  . Kidney cancer Paternal Uncle        dx in his late 72s-30s  . Glomerulonephritis Paternal Uncle     Social History   Socioeconomic History  . Marital status: Married    Spouse name: Not on file  . Number of children: Not on file  . Years of education: Not on file  . Highest education level: Not on file  Occupational History  . Not on file  Tobacco Use  . Smoking status: Never Smoker  . Smokeless tobacco: Never Used  Substance and Sexual Activity  . Alcohol use: Not Currently    Alcohol/week: 0.0 standard drinks  . Drug use: No  . Sexual activity: Not Currently    Partners: Male    Comment: Hyst  Other Topics Concern  . Not on file  Social History Narrative  . Not on file   Social Determinants of Health   Financial Resource Strain:   . Difficulty of Paying Living Expenses:   Food Insecurity:   . Worried About Charity fundraiser in the Last Year:   . Arboriculturist in the Last Year:    Transportation Needs:   . Film/video editor (Medical):   Marland Kitchen Lack of Transportation (Non-Medical):   Physical Activity:   . Days of Exercise per Week:   . Minutes of Exercise per Session:   Stress:   .  Feeling of Stress :   Social Connections:   . Frequency of Communication with Friends and Family:   . Frequency of Social Gatherings with Friends and Family:   . Attends Religious Services:   . Active Member of Clubs or Organizations:   . Attends Archivist Meetings:   Marland Kitchen Marital Status:   Intimate Partner Violence:   . Fear of Current or Ex-Partner:   . Emotionally Abused:   Marland Kitchen Physically Abused:   . Sexually Abused:     Review of Systems  Constitutional: Negative.   HENT: Negative.   Eyes: Negative.   Respiratory: Negative.   Cardiovascular: Negative.   Gastrointestinal: Negative.   Endocrine: Negative.   Genitourinary: Negative.   Musculoskeletal: Negative.   Skin: Negative.   Allergic/Immunologic: Negative.   Neurological: Negative.   Hematological: Negative.   Psychiatric/Behavioral: Negative.     PHYSICAL EXAMINATION:    BP 110/68 (BP Location: Right Arm, Patient Position: Sitting, Cuff Size: Normal)   Pulse 76   Temp (!) 97.3 F (36.3 C) (Temporal)   Ht 5\' 2"  (1.575 m)   Wt 168 lb (76.2 kg)   BMI 30.73 kg/m     General appearance: alert, cooperative and appears stated age   Pelvic: External genitalia:  no lesions              Urethra:  normal appearing urethra with no masses, tenderness or lesions              Bartholins and Skenes: normal                 Vagina: normal appearing vagina with normal color and discharge, no lesions              Cervix: absent                Bimanual Exam:  Uterus:  absent              Adnexa: no mass, fullness, tenderness      Pessary fitting and supply to patient. Ring with support #3 uncomfortable for patient.  Ring with support #2 comfortable and able to maintain pessary with maneuvers.    Patient  received a Premier pessary Ring with support #2, lot number [id number], Ref:  [id number], Expiration 08/27/27.    Chaperone was present for exam.  ASSESSMENT  Rectocele.  LLQ pain improved with treatment of constipation.   PLAN  CT scan findings reviewed.  Risk factors for rectocele reviewed including constipation, straining, and standing.  Return for a recheck of pessary in one week.  She will learn how to place and remove at that time. Given pessary instructions.    An After Visit Summary was printed and given to the patient.  ___20___ minutes face to face time of which over 50% was spent in counseling.

## 2019-12-05 ENCOUNTER — Other Ambulatory Visit: Payer: Self-pay

## 2019-12-05 ENCOUNTER — Ambulatory Visit: Payer: BC Managed Care – PPO | Admitting: Obstetrics and Gynecology

## 2019-12-05 ENCOUNTER — Encounter: Payer: Self-pay | Admitting: Obstetrics and Gynecology

## 2019-12-05 VITALS — BP 126/80 | HR 66 | Temp 97.6°F | Ht 62.0 in | Wt 168.0 lb

## 2019-12-05 DIAGNOSIS — N816 Rectocele: Secondary | ICD-10-CM | POA: Diagnosis not present

## 2019-12-05 DIAGNOSIS — Z4689 Encounter for fitting and adjustment of other specified devices: Secondary | ICD-10-CM | POA: Diagnosis not present

## 2019-12-05 MED ORDER — ESTRADIOL 0.1 MG/GM VA CREA: TOPICAL_CREAM | VAGINAL | 1 refills | Status: DC

## 2019-12-05 NOTE — Progress Notes (Signed)
GYNECOLOGY  VISIT   HPI: 59 y.o.   Married  Caucasian  female   G1P1001 with No LMP recorded. Patient has had a hysterectomy.   here for pessary check. Pessary is falling down with toileting. States her bowel function is much better.   States her constipation is much better since stopping her ibuprofen.   She has a #2 ring with support.   GYNECOLOGIC HISTORY: No LMP recorded. Patient has had a hysterectomy. Contraception: Hyst--ovaries remain Menopausal hormone therapy:  none Last mammogram:  08-29-19 3D/Neg/density B/BiRads1 Last pap smear: 07-12-19 Neg, 05-13-18 Neg:Neg HR HPv, 03-19-17 Neg:Neg HR HPV                OB History    Gravida  1   Para  1   Term  1   Preterm      AB      Living  1     SAB      TAB      Ectopic      Multiple      Live Births                 Patient Active Problem List   Diagnosis Date Noted  . Hyperlipidemia 05/04/2019  . Migraines   . Anxiety   . Genetic testing 05/26/2017  . Family history of uterine cancer   . Family history of ovarian cancer   . Family history of kidney cancer   . Family history of prostate cancer   . Uterine cancer [facility])     Past Medical History:  Diagnosis Date  . Abnormal Pap smear of cervix    --age 23 had precancerous changes on pap and had hysterectomy  . Alopecia   . Anemia   . Anxiety   . Diverticulitis   . Endometriosis   . Family history of kidney cancer   . Family history of ovarian cancer   . Family history of prostate cancer   . Family history of uterine cancer   . High cholesterol   . Migraines   . Renal stones   . Uterine cancer ([place])    dx at 8    Past Surgical History:  Procedure Laterality Date  . ABDOMINAL HYSTERECTOMY     L-TAH--ovaries remain  . left kidney blockage Left    age [age], and age 67  . TONSILLECTOMY AND ADENOIDECTOMY     -age 63  . urethral stent Left    age 11    Current Outpatient Medications  Medication Sig Dispense Refill  . atorvastatin  (LIPITOR) 40 MG tablet TAKE 1 TABLET(40 MG) BY MOUTH AT BEDTIME 90 tablet 1  . Cyanocobalamin (VITAMIN B-12 PO) Take 1 tablet by mouth every other day.     . diphenhydrAMINE HCl (BENADRYL PO) Take by mouth.    . IBUPROFEN PO Take by mouth.    . loratadine (CLARITIN) 10 MG tablet Take 10 mg by mouth daily.    Marland Kitchen MELATONIN PO Take by mouth.    . Multiple Vitamin (MULTIVITAMIN) capsule Take 1 capsule by mouth daily.    . NYSTATIN powder APPLY TOPICALLY TO THE AFFECTED AREA THREE TIMES DAILY FOR UP TO 7 DAYS 30 g 0  . polycarbophil (FIBERCON) 625 MG tablet Take 625 mg by mouth daily.    Vladimir Faster Glycol-Propyl Glycol (SYSTANE) 0.4-0.3 % SOLN Place 1 drop into both eyes 2 (two) times daily.    . SUMAtriptan (IMITREX) 50 MG tablet TAKE 1 TABLET BY  MOUTH DAILY AS NEEDED 10 tablet 3   No current facility-administered medications for this visit.     ALLERGIES: Ivp dye [iodinated diagnostic agents]  Family History  Problem Relation Age of Onset  . Diabetes Mother 16  . Other Mother        passed away from a blood clot after sinus surgery  . Migraines Mother   . COPD Father 95  . Diabetes Father   . Diabetes Sister   . Cancer Maternal Grandfather 71       dec metastatic prostate ca  . Leukemia Paternal Aunt   . Cancer Cousin        uterine and ovarian  . Cancer Cousin        uterine and ovarian  . Kidney cancer Paternal Uncle        dx in his late 26s-30s  . Glomerulonephritis Paternal Uncle     Social History   Socioeconomic History  . Marital status: Married    Spouse name: Not on file  . Number of children: Not on file  . Years of education: Not on file  . Highest education level: Not on file  Occupational History  . Not on file  Tobacco Use  . Smoking status: Never Smoker  . Smokeless tobacco: Never Used  Substance and Sexual Activity  . Alcohol use: Not Currently    Alcohol/week: 0.0 standard drinks  . Drug use: No  . Sexual activity: Not Currently    Partners: Male     Comment: Hyst  Other Topics Concern  . Not on file  Social History Narrative  . Not on file   Social Determinants of Health   Financial Resource Strain:   . Difficulty of Paying Living Expenses:   Food Insecurity:   . Worried About Charity fundraiser in the Last Year:   . Arboriculturist in the Last Year:   Transportation Needs:   . Film/video editor (Medical):   Marland Kitchen Lack of Transportation (Non-Medical):   Physical Activity:   . Days of Exercise per Week:   . Minutes of Exercise per Session:   Stress:   . Feeling of Stress :   Social Connections:   . Frequency of Communication with Friends and Family:   . Frequency of Social Gatherings with Friends and Family:   . Attends Religious Services:   . Active Member of Clubs or Organizations:   . Attends Archivist Meetings:   Marland Kitchen Marital Status:   Intimate Partner Violence:   . Fear of Current or Ex-Partner:   . Emotionally Abused:   Marland Kitchen Physically Abused:   . Sexually Abused:     Review of Systems  All other systems reviewed and are negative.   PHYSICAL EXAMINATION:    BP 126/80   Pulse 66   Temp 97.6 F (36.4 C) (Temporal)   Ht 5\' 2"  (1.575 m)   Wt 168 lb (76.2 kg)   BMI 30.73 kg/m     General appearance: alert, cooperative and appears stated age  Pelvic: External genitalia:  no lesions              Urethra:  normal appearing urethra with no masses, tenderness or lesions              Bartholins and Skenes: normal                 Vagina: normal appearing vagina with normal color and discharge, no lesions  Cervix: absent                Bimanual Exam:  Uterus:  absent              Adnexa: no mass, fullness, tenderness    Premier #3 ring with support - lot number [id number], Exp 09/23/28 Chaperone was present for exam.  ASSESSMENT   Rectocele.  Improved with #2 ring with support but still not ideal.  PLAN  Switch to #3 ring with support.  Pessary given to patient today.  FU in 1  week to learn to place and remove it.  Start vaginal estradiol cream 1/2 gram pv at hs for 2 weeks and then 1/2 gram pv at hs 2 - 3 times per week.   She will start this next week and bring it with her to her appointment.  I discussed potential effect of the estrogen on breast cancer.   An After Visit Summary was printed and given to the patient.  _15____ minutes face to face time of which over 50% was spent in counseling.

## 2019-12-11 NOTE — Progress Notes (Signed)
GYNECOLOGY  VISIT   HPI: 59 y.o.   Married  Caucasian  female   G1P1001 with No LMP recorded. Patient has had a hysterectomy.   here for 1 week pessary follow up.  Doing great!  Pessary is holding well.  No problems having BMs.   States it may feel a little tight, but no pain.  No vaginal bleeding or discharge.  Has not tried to remove it.  She watched a You Tube video.  Using [employer] now.   She is using vaginal estrogen cream.  GYNECOLOGIC HISTORY: No LMP recorded. Patient has had a hysterectomy. Contraception:  Hyst--ovaries remain Menopausal hormone therapy: None Last mammogram: 08-29-19 3D/Neg/density B/BiRads1 Last pap smear: 07-12-19 Neg, 05-13-18 Neg:Neg HR HPv, 03-19-17 Neg:Neg HR HPV         OB History    Gravida  1   Para  1   Term  1   Preterm      AB      Living  1     SAB      TAB      Ectopic      Multiple      Live Births                 Patient Active Problem List   Diagnosis Date Noted  . Hyperlipidemia 05/04/2019  . Migraines   . Anxiety   . Genetic testing 05/26/2017  . Family history of uterine cancer   . Family history of ovarian cancer   . Family history of kidney cancer   . Family history of prostate cancer   . Uterine cancer [facility])     Past Medical History:  Diagnosis Date  . Abnormal Pap smear of cervix    --age 8 had precancerous changes on pap and had hysterectomy  . Alopecia   . Anemia   . Anxiety   . Diverticulitis   . Endometriosis   . Family history of kidney cancer   . Family history of ovarian cancer   . Family history of prostate cancer   . Family history of uterine cancer   . High cholesterol   . Migraines   . Renal stones   . Uterine cancer ([place])    dx at 54    Past Surgical History:  Procedure Laterality Date  . ABDOMINAL HYSTERECTOMY     L-TAH--ovaries remain  . left kidney blockage Left    age [age], and age 78  . TONSILLECTOMY AND ADENOIDECTOMY     -age 78  . urethral stent  Left    age 43    Current Outpatient Medications  Medication Sig Dispense Refill  . atorvastatin (LIPITOR) 40 MG tablet TAKE 1 TABLET(40 MG) BY MOUTH AT BEDTIME 90 tablet 1  . Cyanocobalamin (VITAMIN B-12 PO) Take 1 tablet by mouth every other day.     . diphenhydrAMINE HCl (BENADRYL PO) Take by mouth.    . estradiol (ESTRACE) 0.1 MG/GM vaginal cream Use 1/2 g vaginally every night for the first 2 weeks, then use 1/2 g vaginally two or three times per week as needed to maintain symptom relief. 42.5 g 1  . loratadine (CLARITIN) 10 MG tablet Take 10 mg by mouth daily.    Marland Kitchen MELATONIN PO Take by mouth.    . Multiple Vitamin (MULTIVITAMIN) capsule Take 1 capsule by mouth daily.    . NYSTATIN powder APPLY TOPICALLY TO THE AFFECTED AREA THREE TIMES DAILY FOR UP TO 7 DAYS 30 g  0  . Polyethyl Glycol-Propyl Glycol (SYSTANE) 0.4-0.3 % SOLN Place 1 drop into both eyes 2 (two) times daily.    . SUMAtriptan (IMITREX) 50 MG tablet TAKE 1 TABLET BY MOUTH DAILY AS NEEDED 10 tablet 3  . IBUPROFEN PO Take by mouth.    . polycarbophil (FIBERCON) 625 MG tablet Take 625 mg by mouth daily.     No current facility-administered medications for this visit.     ALLERGIES: Ivp dye [iodinated diagnostic agents]  Family History  Problem Relation Age of Onset  . Diabetes Mother 21  . Other Mother        passed away from a blood clot after sinus surgery  . Migraines Mother   . COPD Father 83  . Diabetes Father   . Diabetes Sister   . Cancer Maternal Grandfather 49       dec metastatic prostate ca  . Leukemia Paternal Aunt   . Cancer Cousin        uterine and ovarian  . Cancer Cousin        uterine and ovarian  . Kidney cancer Paternal Uncle        dx in his late 54s-30s  . Glomerulonephritis Paternal Uncle     Social History   Socioeconomic History  . Marital status: Married    Spouse name: Not on file  . Number of children: Not on file  . Years of education: Not on file  . Highest education  level: Not on file  Occupational History  . Not on file  Tobacco Use  . Smoking status: Never Smoker  . Smokeless tobacco: Never Used  Substance and Sexual Activity  . Alcohol use: Not Currently    Alcohol/week: 0.0 standard drinks  . Drug use: No  . Sexual activity: Not Currently    Partners: Male    Comment: Hyst  Other Topics Concern  . Not on file  Social History Narrative  . Not on file   Social Determinants of Health   Financial Resource Strain:   . Difficulty of Paying Living Expenses:   Food Insecurity:   . Worried About Charity fundraiser in the Last Year:   . Arboriculturist in the Last Year:   Transportation Needs:   . Film/video editor (Medical):   Marland Kitchen Lack of Transportation (Non-Medical):   Physical Activity:   . Days of Exercise per Week:   . Minutes of Exercise per Session:   Stress:   . Feeling of Stress :   Social Connections:   . Frequency of Communication with Friends and Family:   . Frequency of Social Gatherings with Friends and Family:   . Attends Religious Services:   . Active Member of Clubs or Organizations:   . Attends Archivist Meetings:   Marland Kitchen Marital Status:   Intimate Partner Violence:   . Fear of Current or Ex-Partner:   . Emotionally Abused:   Marland Kitchen Physically Abused:   . Sexually Abused:     Review of Systems  All other systems reviewed and are negative.   PHYSICAL EXAMINATION:    BP 124/80   Pulse 84   Temp (!) 97.2 F (36.2 C) (Temporal)   Ht 5' 2" (1.575 m)   Wt 168 lb (76.2 kg)   BMI 30.73 kg/m     General appearance: alert, cooperative and appears stated age    Pelvic: External genitalia:  no lesions  Urethra:  normal appearing urethra with no masses, tenderness or lesions              Bartholins and Skenes: normal                 Vagina: normal appearing vagina with normal color and discharge, no lesions.              Cervix: absent                Bimanual Exam:  Uterus:  absent               Adnexa: no mass, fullness, tenderness   Pessary removed, cleansed, and patient taught how to place and remove.  The pessary from the fitting kit was also present and removed.             Chaperone was present for exam.  ASSESSMENT  Rectocele.  Doing well with pessary.   PLAN  Patient was taught how to place and remove the pessary.   I did disclose to her that the pessary from the fitting kit and the new pessary she received were in the vagina.  I apologized for this and told her her vaginal tissue was healthy.  Continue with vaginal estrogen.   We discussed potential long term use of the pessary if desired.  Fu in 4 - 6 weeks for her next recheck.   An After Visit Summary was printed and given to the patient.  __20____ minutes face to face time of which over 50% was spent in counseling.

## 2019-12-12 ENCOUNTER — Other Ambulatory Visit: Payer: Self-pay

## 2019-12-12 ENCOUNTER — Ambulatory Visit: Payer: BC Managed Care – PPO | Admitting: Obstetrics and Gynecology

## 2019-12-12 ENCOUNTER — Encounter: Payer: Self-pay | Admitting: Obstetrics and Gynecology

## 2019-12-12 VITALS — BP 124/80 | HR 84 | Temp 97.2°F | Ht 62.0 in | Wt 168.0 lb

## 2019-12-12 DIAGNOSIS — N816 Rectocele: Secondary | ICD-10-CM

## 2019-12-12 DIAGNOSIS — Z4689 Encounter for fitting and adjustment of other specified devices: Secondary | ICD-10-CM | POA: Diagnosis not present

## 2020-01-09 NOTE — Progress Notes (Signed)
GYNECOLOGY  VISIT   HPI: 59 y.o.   Married  Caucasian  female   G1P1001 with No LMP recorded. Patient has had a hysterectomy.   here for pessary check. Patient doing well with pessary.  No urinary incontinence.  Has time to get to the bathroom.  No more prolapse!  Able to place and remove the pessary twice a week.   Using vaginal estrogen cream.   Bowel function is doing well.  Using a stool softener.  Changing her diet.   She states she did restart her Celexa and is doing better.  GYNECOLOGIC HISTORY: No LMP recorded. Patient has had a hysterectomy. Contraception: Hyst Menopausal hormone therapy:  None Last mammogram: 08-29-19 3D/Neg/density B/BiRads1 Last pap smear: 07-12-19 Neg, 05-13-18 Neg:Neg HR HPv, 03-19-17 Neg:Neg HR HPV         OB History    Gravida  1   Para  1   Term  1   Preterm      AB      Living  1     SAB      TAB      Ectopic      Multiple      Live Births                 Patient Active Problem List   Diagnosis Date Noted  . Hyperlipidemia 05/04/2019  . Migraines   . Anxiety   . Genetic testing 05/26/2017  . Family history of uterine cancer   . Family history of ovarian cancer   . Family history of kidney cancer   . Family history of prostate cancer   . Uterine cancer [facility])     Past Medical History:  Diagnosis Date  . Abnormal Pap smear of cervix    --age 20 had precancerous changes on pap and had hysterectomy  . Alopecia   . Anemia   . Anxiety   . Diverticulitis   . Endometriosis   . Family history of kidney cancer   . Family history of ovarian cancer   . Family history of prostate cancer   . Family history of uterine cancer   . High cholesterol   . Migraines   . Renal stones   . Uterine cancer ([place])    dx at 52    Past Surgical History:  Procedure Laterality Date  . ABDOMINAL HYSTERECTOMY     L-TAH--ovaries remain  . left kidney blockage Left    age [age], and age 25  . TONSILLECTOMY AND  ADENOIDECTOMY     -age 38  . urethral stent Left    age 38    Current Outpatient Medications  Medication Sig Dispense Refill  . atorvastatin (LIPITOR) 40 MG tablet TAKE 1 TABLET(40 MG) BY MOUTH AT BEDTIME 90 tablet 1  . citalopram (CELEXA) 40 MG tablet Take 1 tablet by mouth at bedtime as needed.    . Cyanocobalamin (VITAMIN B-12 PO) Take 1 tablet by mouth every other day.     . diphenhydrAMINE HCl (BENADRYL PO) Take by mouth.    . estradiol (ESTRACE) 0.1 MG/GM vaginal cream Use 1/2 g vaginally every night for the first 2 weeks, then use 1/2 g vaginally two or three times per week as needed to maintain symptom relief. 42.5 g 1  . loratadine (CLARITIN) 10 MG tablet Take 10 mg by mouth daily.    Marland Kitchen MELATONIN PO Take by mouth.    . Multiple Vitamin (MULTIVITAMIN) capsule Take 1 capsule by mouth  daily.    . NYSTATIN powder APPLY TOPICALLY TO THE AFFECTED AREA THREE TIMES DAILY FOR UP TO 7 DAYS 30 g 0  . Polyethyl Glycol-Propyl Glycol (SYSTANE) 0.4-0.3 % SOLN Place 1 drop into both eyes 2 (two) times daily.    . SUMAtriptan (IMITREX) 50 MG tablet TAKE 1 TABLET BY MOUTH DAILY AS NEEDED 10 tablet 3   No current facility-administered medications for this visit.     ALLERGIES: Ivp dye [iodinated diagnostic agents]  Family History  Problem Relation Age of Onset  . Diabetes Mother 97  . Other Mother        passed away from a blood clot after sinus surgery  . Migraines Mother   . COPD Father 29  . Diabetes Father   . Diabetes Sister   . Cancer Maternal Grandfather 54       dec metastatic prostate ca  . Leukemia Paternal Aunt   . Cancer Cousin        uterine and ovarian  . Cancer Cousin        uterine and ovarian  . Kidney cancer Paternal Uncle        dx in his late 5s-30s  . Glomerulonephritis Paternal Uncle     Social History   Socioeconomic History  . Marital status: Married    Spouse name: Not on file  . Number of children: Not on file  . Years of education: Not on file  .  Highest education level: Not on file  Occupational History  . Not on file  Tobacco Use  . Smoking status: Never Smoker  . Smokeless tobacco: Never Used  Vaping Use  . Vaping Use: Never used  Substance and Sexual Activity  . Alcohol use: Not Currently    Alcohol/week: 0.0 standard drinks  . Drug use: No  . Sexual activity: Not Currently    Partners: Male    Comment: Hyst  Other Topics Concern  . Not on file  Social History Narrative  . Not on file   Social Determinants of Health   Financial Resource Strain:   . Difficulty of Paying Living Expenses:   Food Insecurity:   . Worried About Charity fundraiser in the Last Year:   . Arboriculturist in the Last Year:   Transportation Needs:   . Film/video editor (Medical):   Marland Kitchen Lack of Transportation (Non-Medical):   Physical Activity:   . Days of Exercise per Week:   . Minutes of Exercise per Session:   Stress:   . Feeling of Stress :   Social Connections:   . Frequency of Communication with Friends and Family:   . Frequency of Social Gatherings with Friends and Family:   . Attends Religious Services:   . Active Member of Clubs or Organizations:   . Attends Archivist Meetings:   Marland Kitchen Marital Status:   Intimate Partner Violence:   . Fear of Current or Ex-Partner:   . Emotionally Abused:   Marland Kitchen Physically Abused:   . Sexually Abused:     Review of Systems  All other systems reviewed and are negative.   PHYSICAL EXAMINATION:    BP 122/76   Pulse 80   Temp (!) 97 F (36.1 C) (Temporal)   Ht 5\' 2"  (1.575 m)   Wt 171 lb (77.6 kg)   BMI 31.28 kg/m     General appearance: alert, cooperative and appears stated age   Pelvic: External genitalia:  no lesions  Urethra:  normal appearing urethra with no masses, tenderness or lesions              Bartholins and Skenes: normal                 Vagina: normal appearing vagina with normal color and discharge, no lesions              Cervix: absent                 Bimanual Exam:  Uterus:  absent              Adnexa: no mass, fullness, tenderness           Pessary removed, cleansed and given to patient in a biobag.  It has mild yellow staining.   She was given a new Premier pessary #3 ring with support, lot number [id number], expiration 07/26/29.   Chaperone was present for exam.  ASSESSMENT  Rectocele. Pessary maintenance. Doing well with pessary.   PLAN  Continue pessary use and vaginal estrogen twice weekly. Fu for annual exam and prn.    An After Visit Summary was printed and given to the patient.  21 minutes patient care visit.

## 2020-01-10 ENCOUNTER — Ambulatory Visit: Payer: BC Managed Care – PPO | Admitting: Obstetrics and Gynecology

## 2020-01-10 ENCOUNTER — Other Ambulatory Visit: Payer: Self-pay

## 2020-01-10 ENCOUNTER — Encounter: Payer: Self-pay | Admitting: Obstetrics and Gynecology

## 2020-01-10 VITALS — BP 122/76 | HR 80 | Temp 97.0°F | Ht 62.0 in | Wt 171.0 lb

## 2020-01-10 DIAGNOSIS — N816 Rectocele: Secondary | ICD-10-CM

## 2020-01-10 DIAGNOSIS — Z4689 Encounter for fitting and adjustment of other specified devices: Secondary | ICD-10-CM | POA: Diagnosis not present

## 2020-01-24 ENCOUNTER — Encounter: Payer: Self-pay | Admitting: Family Medicine

## 2020-01-24 ENCOUNTER — Other Ambulatory Visit: Payer: Self-pay

## 2020-01-24 ENCOUNTER — Ambulatory Visit (INDEPENDENT_AMBULATORY_CARE_PROVIDER_SITE_OTHER): Payer: BC Managed Care – PPO | Admitting: Family Medicine

## 2020-01-24 VITALS — BP 108/80 | HR 72 | Temp 97.4°F | Ht 62.0 in | Wt 170.1 lb

## 2020-01-24 DIAGNOSIS — Z0000 Encounter for general adult medical examination without abnormal findings: Secondary | ICD-10-CM

## 2020-01-24 DIAGNOSIS — G43909 Migraine, unspecified, not intractable, without status migrainosus: Secondary | ICD-10-CM | POA: Diagnosis not present

## 2020-01-24 DIAGNOSIS — E538 Deficiency of other specified B group vitamins: Secondary | ICD-10-CM

## 2020-01-24 DIAGNOSIS — R7301 Impaired fasting glucose: Secondary | ICD-10-CM

## 2020-01-24 DIAGNOSIS — E785 Hyperlipidemia, unspecified: Secondary | ICD-10-CM | POA: Diagnosis not present

## 2020-01-24 DIAGNOSIS — F419 Anxiety disorder, unspecified: Secondary | ICD-10-CM | POA: Diagnosis not present

## 2020-01-24 DIAGNOSIS — Z23 Encounter for immunization: Secondary | ICD-10-CM

## 2020-01-24 DIAGNOSIS — R7989 Other specified abnormal findings of blood chemistry: Secondary | ICD-10-CM

## 2020-01-24 NOTE — Progress Notes (Signed)
Maria Gross DOB: 1961-05-17 Encounter date: 01/24/2020  This is a 59 y.o. female who presents for complete physical   History of present illness/Additional concerns: Has been doing [employer] and really watching carbs, eating more protein and having Adkins shakes for lunch. Happy about weight decreasing. Has not been exercising regularly.   Migraine: Sumatriptan as needed. Has had to take quite a few tablets in last year+. Does work for her when she is taking it. Usually waits until after work to take it.  Anxiety: Celexa 40 mg. Has been better. Tried to go off of all medication in January and that didn't work so well. Not taking benzodiazepine any longer.   Hyperlipidemia: Lipitor 40 mg. Tolerating this dose.   B12 deficiency taking oral B12  Following with Dr. Quincy Simmonds for gynecology needs. She is doing well with pessary.  Next colonoscopy due: 12/2023  Past Medical History:  Diagnosis Date  . Abnormal Pap smear of cervix    --age 67 had precancerous changes on pap and had hysterectomy  . Alopecia   . Anemia   . Anxiety   . Diverticulitis   . Endometriosis   . Family history of kidney cancer   . Family history of ovarian cancer   . Family history of prostate cancer   . Family history of uterine cancer   . High cholesterol   . Migraines   . Renal stones   . Uterine cancer ([place])    dx at 64   Past Surgical History:  Procedure Laterality Date  . ABDOMINAL HYSTERECTOMY     L-TAH--ovaries remain  . left kidney blockage Left    age [age], and age 79  . TONSILLECTOMY AND ADENOIDECTOMY     -age 11  . urethral stent Left    age 44   Allergies  Allergen Reactions  . Ivp Dye [Iodinated Diagnostic Agents] Other (See Comments)    seizures   Current Meds  Medication Sig  . atorvastatin (LIPITOR) 40 MG tablet TAKE 1 TABLET(40 MG) BY MOUTH AT BEDTIME  . citalopram (CELEXA) 40 MG tablet Take 1 tablet by mouth at bedtime as needed.  . Cyanocobalamin (VITAMIN B-12 PO) Take 1  tablet by mouth every other day.   . diphenhydrAMINE HCl (BENADRYL PO) Take by mouth.  . estradiol (ESTRACE) 0.1 MG/GM vaginal cream Use 1/2 g vaginally every night for the first 2 weeks, then use 1/2 g vaginally two or three times per week as needed to maintain symptom relief.  . loratadine (CLARITIN) 10 MG tablet Take 10 mg by mouth daily.  Marland Kitchen MELATONIN PO Take by mouth.  . Multiple Vitamin (MULTIVITAMIN) capsule Take 1 capsule by mouth daily.  . NYSTATIN powder APPLY TOPICALLY TO THE AFFECTED AREA THREE TIMES DAILY FOR UP TO 7 DAYS  . Polyethyl Glycol-Propyl Glycol (SYSTANE) 0.4-0.3 % SOLN Place 1 drop into both eyes 2 (two) times daily.  . SUMAtriptan (IMITREX) 50 MG tablet TAKE 1 TABLET BY MOUTH DAILY AS NEEDED   Social History   Tobacco Use  . Smoking status: Never Smoker  . Smokeless tobacco: Never Used  Substance Use Topics  . Alcohol use: Not Currently    Alcohol/week: 0.0 standard drinks   Family History  Problem Relation Age of Onset  . Diabetes Mother 41  . Other Mother        passed away from a blood clot after sinus surgery  . Migraines Mother   . COPD Father 56  . Diabetes Father   .  Diabetes Sister   . Cancer Maternal Grandfather 44       dec metastatic prostate ca  . Leukemia Paternal Aunt   . Cancer Cousin        uterine and ovarian  . Cancer Cousin        uterine and ovarian  . Kidney cancer Paternal Uncle        dx in his late 75s-30s  . Glomerulonephritis Paternal Uncle      Review of Systems  Constitutional: Negative for activity change, appetite change, chills, fatigue, fever and unexpected weight change.  HENT: Negative for congestion, ear pain, hearing loss, sinus pressure, sinus pain, sore throat and trouble swallowing.   Eyes: Negative for pain and visual disturbance.  Respiratory: Negative for cough, chest tightness, shortness of breath and wheezing.   Cardiovascular: Negative for chest pain, palpitations and leg swelling.  Gastrointestinal:  Negative for abdominal pain, blood in stool, constipation, diarrhea, nausea and vomiting.  Genitourinary: Negative for difficulty urinating and menstrual problem.  Musculoskeletal: Negative for arthralgias and back pain.  Skin: Negative for rash.  Neurological: Negative for dizziness, weakness, numbness and headaches.  Hematological: Negative for adenopathy. Does not bruise/bleed easily.  Psychiatric/Behavioral: Negative for sleep disturbance and suicidal ideas. The patient is not nervous/anxious.     CBC:  Lab Results  Component Value Date   WBC 9.1 11/21/2019   WBC 8.6 12/26/2013   HGB 13.1 11/21/2019   HCT 39.4 11/21/2019   MCH 30.2 11/21/2019   MCH 30.0 12/26/2013   MCHC 33.2 11/21/2019   MCHC 33.2 12/26/2013   RDW 12.6 11/21/2019   PLT 270 11/21/2019   CMP: Lab Results  Component Value Date   NA 143 11/21/2019   K 4.0 11/21/2019   CL 102 11/21/2019   CO2 25 11/21/2019   GLUCOSE 87 11/21/2019   GLUCOSE 104 (H) 04/14/2019   BUN 16 11/21/2019   CREATININE 0.73 11/21/2019   LABGLOB 2.3 11/21/2019   GFRAA 105 11/21/2019   CALCIUM 9.4 11/21/2019   PROT 7.1 11/21/2019   AGRATIO 2.1 11/21/2019   BILITOT 0.7 11/21/2019   ALKPHOS 68 11/21/2019   ALT 25 11/21/2019   AST 22 11/21/2019   LIPID: Lab Results  Component Value Date   CHOL 157 04/14/2019   TRIG 128.0 04/14/2019   HDL 55.50 04/14/2019   LDLCALC 76 04/14/2019    Objective:  BP 108/80 (BP Location: Left Arm, Patient Position: Sitting, Cuff Size: Large)   Pulse 72   Temp (!) 97.4 F (36.3 C) (Temporal)   Ht 5\' 2"  (1.575 m)   Wt 170 lb 1.6 oz (77.2 kg)   BMI 31.11 kg/m   Weight: 170 lb 1.6 oz (77.2 kg)   BP Readings from Last 3 Encounters:  01/24/20 108/80  01/10/20 122/76  12/12/19 124/80   Wt Readings from Last 3 Encounters:  01/24/20 170 lb 1.6 oz (77.2 kg)  01/10/20 171 lb (77.6 kg)  12/12/19 168 lb (76.2 kg)    Physical Exam Constitutional:      General: She is not in acute distress.     Appearance: She is well-developed.  HENT:     Head: Normocephalic and atraumatic.     Right Ear: External ear normal.     Left Ear: External ear normal.     Mouth/Throat:     Pharynx: No oropharyngeal exudate.  Eyes:     Conjunctiva/sclera: Conjunctivae normal.     Pupils: Pupils are equal, round, and reactive to light.  Neck:  Thyroid: No thyromegaly.  Cardiovascular:     Rate and Rhythm: Normal rate and regular rhythm.     Heart sounds: Normal heart sounds. No murmur heard.  No friction rub. No gallop.   Pulmonary:     Effort: Pulmonary effort is normal.     Breath sounds: Normal breath sounds.  Abdominal:     General: Bowel sounds are normal. There is no distension.     Palpations: Abdomen is soft. There is no mass.     Tenderness: There is no abdominal tenderness. There is no guarding.     Hernia: No hernia is present.  Musculoskeletal:        General: No tenderness or deformity. Normal range of motion.     Cervical back: Normal range of motion and neck supple.  Lymphadenopathy:     Cervical: No cervical adenopathy.  Skin:    General: Skin is warm and dry.     Findings: No rash.  Neurological:     Mental Status: She is alert and oriented to person, place, and time.     Deep Tendon Reflexes: Reflexes normal.     Reflex Scores:      Tricep reflexes are 2+ on the right side and 2+ on the left side.      Bicep reflexes are 2+ on the right side and 2+ on the left side.      Brachioradialis reflexes are 2+ on the right side and 2+ on the left side.      Patellar reflexes are 2+ on the right side and 2+ on the left side. Psychiatric:        Speech: Speech normal.        Behavior: Behavior normal.        Thought Content: Thought content normal.     Assessment/Plan: There are no preventive care reminders to display for this patient. Health Maintenance reviewed.  1. Preventative health care Will give second shingrix today. - CBC with Differential/Platelet;  Future  2. Migraine without status migrainosus, not intractable, unspecified migraine type Stable. imitrex is working well.   3. Hyperlipidemia, unspecified hyperlipidemia type Will recheck levels in fal-winter prior to next visit - Lipid panel; Future - Comprehensive metabolic panel; Future  4. Anxiety Stable on the celexa. Doing well with this medication. Anxiety and depressed mood were prominent when she tried to stop celexa.  5. Impaired fasting glucose Doing great with low carb diet. - Hemoglobin A1c; Future  6. B12 deficiency - Vitamin B12; Future  7. Low vitamin D level - VITAMIN D 25 Hydroxy (Vit-D Deficiency, Fractures); Future  Return in about 6 months (around 07/25/2020) for bloodwork followed by chronic condition visit.  Micheline Rough, MD

## 2020-01-24 NOTE — Addendum Note (Signed)
Addended by: Agnes Lawrence on: 01/24/2020 08:49 AM   Modules accepted: Orders

## 2020-01-24 NOTE — Patient Instructions (Signed)
Keep up the great work with weight loss! I think adding in some daily exercise will help keep that weight moving in the right direction.

## 2020-05-03 ENCOUNTER — Other Ambulatory Visit: Payer: Self-pay | Admitting: Family Medicine

## 2020-06-25 NOTE — Addendum Note (Signed)
Addended by: Marrion Coy on: 06/25/2020 03:33 PM   Modules accepted: Orders

## 2020-07-23 ENCOUNTER — Other Ambulatory Visit: Payer: BC Managed Care – PPO

## 2020-07-31 ENCOUNTER — Ambulatory Visit: Payer: BC Managed Care – PPO | Admitting: Family Medicine

## 2020-08-04 NOTE — Progress Notes (Deleted)
60 y.o. G41P1001 Married {Race/ethnicity:17218} female here for annual exam.    PCP:  Micheline Rough, MD  No LMP recorded. Patient has had a hysterectomy.           Sexually active: {yes no:314532}  The current method of family planning is {contraception:315051}.    Exercising: {yes no:314532}  {types:19826} Smoker:  {YES [id number]  Health Maintenance: Pap: 07/12/2019, negative; negative HPV; 05-13-18 Neg:Neg HR HPV,03-19-17 Neg:Neg HR HPV History of abnormal Pap: Yes, at 60y.o.had pre-cancerous cells on pap which lead to hysterectomy. MMG: 08/29/19, negative  Colonoscopy:  01-09-14 normal except for diverticulitis and internal hemorrhoids;next due 12/2023. BMD: 2010  Result :normal at [place]: 02/01/15 Gardasil: No HIV: PCP Hep C: 04-14-19 Neg Screening Labs:     reports that she has never smoked. She has never used smokeless tobacco. She reports previous alcohol use. She reports that she does not use drugs.  Past Medical History:  Diagnosis Date  . Abnormal Pap smear of cervix    --age 34 had precancerous changes on pap and had hysterectomy  . Alopecia   . Anemia   . Anxiety   . Diverticulitis   . Endometriosis   . Family history of kidney cancer   . Family history of ovarian cancer   . Family history of prostate cancer   . Family history of uterine cancer   . High cholesterol   . Migraines   . Renal stones   . Uterine cancer ([place])    dx at 10    Past Surgical History:  Procedure Laterality Date  . ABDOMINAL HYSTERECTOMY     L-TAH--ovaries remain  . left kidney blockage Left    age [age], and age 11  . TONSILLECTOMY AND ADENOIDECTOMY     -age 29  . urethral stent Left    age 19    Current Outpatient Medications  Medication Sig Dispense Refill  . atorvastatin (LIPITOR) 40 MG tablet TAKE 1 TABLET(40 MG) BY MOUTH AT BEDTIME 90 tablet 1  . citalopram (CELEXA) 40 MG tablet TAKE 1 TABLET(40 MG) BY MOUTH AT BEDTIME 90 tablet 1  . Cyanocobalamin (VITAMIN  B-12 PO) Take 1 tablet by mouth every other day.     . diphenhydrAMINE HCl (BENADRYL PO) Take by mouth.    . estradiol (ESTRACE) 0.1 MG/GM vaginal cream Use 1/2 g vaginally every night for the first 2 weeks, then use 1/2 g vaginally two or three times per week as needed to maintain symptom relief. 42.5 g 1  . loratadine (CLARITIN) 10 MG tablet Take 10 mg by mouth daily.    Marland Kitchen MELATONIN PO Take by mouth.    . Multiple Vitamin (MULTIVITAMIN) capsule Take 1 capsule by mouth daily.    . NYSTATIN powder APPLY TOPICALLY TO THE AFFECTED AREA THREE TIMES DAILY FOR UP TO 7 DAYS 30 g 0  . Polyethyl Glycol-Propyl Glycol (SYSTANE) 0.4-0.3 % SOLN Place 1 drop into both eyes 2 (two) times daily.    . SUMAtriptan (IMITREX) 50 MG tablet TAKE 1 TABLET BY MOUTH DAILY AS NEEDED 10 tablet 3   No current facility-administered medications for this visit.    Family History  Problem Relation Age of Onset  . Diabetes Mother 46  . Other Mother        passed away from a blood clot after sinus surgery  . Migraines Mother   . COPD Father 24  . Diabetes Father   . Diabetes Sister   . Cancer Maternal Grandfather 47  dec metastatic prostate ca  . Leukemia Paternal Aunt   . Cancer Cousin        uterine and ovarian  . Cancer Cousin        uterine and ovarian  . Kidney cancer Paternal Uncle        dx in his late 39s-30s  . Glomerulonephritis Paternal Uncle     Review of Systems  Exam:   There were no vitals taken for this visit.    General appearance: alert, cooperative and appears stated age Head: normocephalic, without obvious abnormality, atraumatic Neck: no adenopathy, supple, symmetrical, trachea midline and thyroid normal to inspection and palpation Lungs: clear to auscultation bilaterally Breasts: normal appearance, no masses or tenderness, No nipple retraction or dimpling, No nipple discharge or bleeding, No axillary adenopathy Heart: regular rate and rhythm Abdomen: soft, non-tender; no masses,  no organomegaly Extremities: extremities normal, atraumatic, no cyanosis or edema Skin: skin color, texture, turgor normal. No rashes or lesions Lymph nodes: cervical, supraclavicular, and axillary nodes normal. Neurologic: grossly normal  Pelvic: External genitalia:  no lesions              No abnormal inguinal nodes palpated.              Urethra:  normal appearing urethra with no masses, tenderness or lesions              Bartholins and Skenes: normal                 Vagina: normal appearing vagina with normal color and discharge, no lesions              Cervix: no lesions              Pap taken: {yes no:314532} Bimanual Exam:  Uterus:  normal size, contour, position, consistency, mobility, non-tender              Adnexa: no mass, fullness, tenderness              Rectal exam: {yes no:314532}.  Confirms.              Anus:  normal sphincter tone, no lesions  Chaperone was present for exam.  Assessment:   Well woman visit with normal exam.   Plan: Mammogram screening discussed. Self breast awareness reviewed. Pap and HR HPV as above. Guidelines for Calcium, Vitamin D, regular exercise program including cardiovascular and weight bearing exercise.   Follow up annually and prn.   Additional counseling given.  {yes [id number]. _______ minutes face to face time of which over 50% was spent in counseling.    After visit summary provided.

## 2020-08-05 ENCOUNTER — Ambulatory Visit: Payer: BC Managed Care – PPO | Admitting: Obstetrics and Gynecology

## 2020-08-23 ENCOUNTER — Encounter: Payer: Self-pay | Admitting: Family Medicine

## 2020-08-30 ENCOUNTER — Ambulatory Visit: Payer: BC Managed Care – PPO | Admitting: Family Medicine

## 2020-08-30 ENCOUNTER — Other Ambulatory Visit: Payer: BC Managed Care – PPO

## 2020-09-06 ENCOUNTER — Ambulatory Visit: Payer: BC Managed Care – PPO | Admitting: Family Medicine

## 2020-10-09 ENCOUNTER — Other Ambulatory Visit: Payer: Self-pay | Admitting: Obstetrics and Gynecology

## 2020-10-10 NOTE — Telephone Encounter (Signed)
Spoke with patient and let her know she is past due for AEX with last one 07/12/19.  She would like to go ahead and schedule. I sent Rosemarie Ax a staff message and asked her to let me know the date scheduled so I can send it with refill request to Dr. Quincy Simmonds.

## 2020-10-10 NOTE — Telephone Encounter (Signed)
AEX Is scheduled for 10/22/20. Last AEX 07/12/19

## 2020-10-18 ENCOUNTER — Other Ambulatory Visit: Payer: Self-pay | Admitting: Obstetrics and Gynecology

## 2020-10-18 DIAGNOSIS — Z1231 Encounter for screening mammogram for malignant neoplasm of breast: Secondary | ICD-10-CM

## 2020-10-21 NOTE — Progress Notes (Signed)
60 y.o. G37P1001 Married Caucasian female here for annual exam.    Pessary is working well.  Having heat rash under breasts.   Received her Covid booster.   PCP: Micheline Rough, MD    No LMP recorded. Patient has had a hysterectomy.           Sexually active: No.  The current method of family planning is status post hysterectomy.    Exercising: No.  The patient does not participate in regular exercise at present. Smoker:  no  Health Maintenance: Pap: 07-12-19 Neg:Neg HR HPV, 05-13-18 Neg:Neg HR HPV,03-19-17 Neg:Neg HR HPV History of abnormal Pap:  Yes, at 60y.o.had pre-cancerous cells on pap which lead to hysterectomy. MMG: 08-30-19 3D/Neg/BiRads1-- Appt. 10/28/20 Colonoscopy: 01-09-14 normal except for diverticulitis and internal hemorrhoids;next due 12/2023. BMD: 2010  Result :Normal at [place]:  02-01-15 Gardasil:   no HIV:PCP Hep C: 04-14-19 NR Screening Labs:  PCP.   reports that she has never smoked. She has never used smokeless tobacco. She reports previous alcohol use. She reports that she does not use drugs.  Past Medical History:  Diagnosis Date  . Abnormal Pap smear of cervix    --age 81 had precancerous changes on pap and had hysterectomy  . Alopecia   . Anemia   . Anxiety   . Diverticulitis   . Endometriosis   . Family history of kidney cancer   . Family history of ovarian cancer   . Family history of prostate cancer   . Family history of uterine cancer   . High cholesterol   . Migraines   . Renal stones   . Uterine cancer ([place])    dx at 34    Past Surgical History:  Procedure Laterality Date  . ABDOMINAL HYSTERECTOMY     L-TAH--ovaries remain  . left kidney blockage Left    age [age], and age 27  . TONSILLECTOMY AND ADENOIDECTOMY     -age 52  . urethral stent Left    age 85    Current Outpatient Medications  Medication Sig Dispense Refill  . atorvastatin (LIPITOR) 40 MG tablet TAKE 1 TABLET(40 MG) BY MOUTH AT BEDTIME 90 tablet 1  .  citalopram (CELEXA) 40 MG tablet TAKE 1 TABLET(40 MG) BY MOUTH AT BEDTIME 90 tablet 1  . Cyanocobalamin (VITAMIN B-12 PO) Take 1 tablet by mouth every other day.     . diphenhydrAMINE HCl (BENADRYL PO) Take by mouth.    . estradiol (ESTRACE) 0.1 MG/GM vaginal cream Use 1/2 g vaginally every night for the first 2 weeks, then use 1/2 g vaginally two or three times per week as needed to maintain symptom relief. 42.5 g 1  . loratadine (CLARITIN) 10 MG tablet Take 10 mg by mouth daily.    Marland Kitchen MELATONIN PO Take by mouth.    . Multiple Vitamin (MULTIVITAMIN) capsule Take 1 capsule by mouth daily.    . NYSTATIN powder APPLY TOPICALLY TO THE AFFECTED AREA THREE TIMES DAILY FOR UP TO 7 DAYS 30 g 0  . Polyethyl Glycol-Propyl Glycol (SYSTANE) 0.4-0.3 % SOLN Place 1 drop into both eyes 2 (two) times daily.    . SUMAtriptan (IMITREX) 50 MG tablet TAKE 1 TABLET BY MOUTH DAILY AS NEEDED 10 tablet 3   No current facility-administered medications for this visit.    Family History  Problem Relation Age of Onset  . Diabetes Mother 84  . Other Mother        passed away from a blood  clot after sinus surgery  . Migraines Mother   . COPD Father 53  . Diabetes Father   . Diabetes Sister   . Cancer Maternal Grandfather 73       dec metastatic prostate ca  . Leukemia Paternal Aunt   . Cancer Cousin        uterine and ovarian  . Cancer Cousin        uterine and ovarian  . Kidney cancer Paternal Uncle        dx in his late 43s-30s  . Glomerulonephritis Paternal Uncle     Review of Systems  All other systems reviewed and are negative.   Exam:   BP 130/74   Pulse 95   Ht 5\' 2"  (1.575 m)   Wt 168 lb (76.2 kg)   SpO2 99%   BMI 30.73 kg/m     General appearance: alert, cooperative and appears stated age Head: normocephalic, without obvious abnormality, atraumatic Neck: no adenopathy, supple, symmetrical, trachea midline and thyroid normal to inspection and palpation Lungs: clear to auscultation  bilaterally Breasts: normal appearance, no masses or tenderness, No nipple retraction or dimpling, No nipple discharge or bleeding, No axillary adenopathy Heart: regular rate and rhythm Abdomen: soft, non-tender; no masses, no organomegaly Extremities: extremities normal, atraumatic, no cyanosis or edema Skin: skin color, texture, turgor normal. No rashes or lesions Lymph nodes: cervical, supraclavicular, and axillary nodes normal. Neurologic: grossly normal  Pelvic: External genitalia:  no lesions              No abnormal inguinal nodes palpated.              Urethra:  normal appearing urethra with no masses, tenderness or lesions              Bartholins and Skenes: normal                 Vagina: normal appearing vagina with normal color and discharge, no lesions.  Second degree rectocele.              Cervix: absent              Pap taken: No. Bimanual Exam:  Uterus:  absent              Adnexa: no mass, fullness, tenderness              Rectal exam: Yes.  .  Confirms.              Anus:  normal sphincter tone, no lesions  Pessary removed, cleansed and given to patient.   Chaperone was present for exam.  Assessment:   Well woman visit with normal exam. Status post hysterectomy for cervical dysplasiaage 36.Ovaries remain.  Hx GSI.  Rectocele. Alopecia. Paternal family with cancer history.Negative genetic testing. Had POLE VUS.  Candida of flexural skin.  Plan: Mammogram screening discussed. Self breast awareness reviewed. Pap and HR HPV as above. Guidelines for Calcium, Vitamin D, regular exercise program including cardiovascular and weight bearing exercise. Refill of Nystatin.  Refill of vaginal estrogen cream.  I discussed potential effect on breast cancer. Follow up annually and prn.

## 2020-10-22 ENCOUNTER — Encounter: Payer: Self-pay | Admitting: Obstetrics and Gynecology

## 2020-10-22 ENCOUNTER — Other Ambulatory Visit: Payer: Self-pay

## 2020-10-22 ENCOUNTER — Ambulatory Visit (INDEPENDENT_AMBULATORY_CARE_PROVIDER_SITE_OTHER): Payer: BC Managed Care – PPO | Admitting: Obstetrics and Gynecology

## 2020-10-22 VITALS — BP 130/74 | HR 95 | Ht 62.0 in | Wt 168.0 lb

## 2020-10-22 DIAGNOSIS — Z01419 Encounter for gynecological examination (general) (routine) without abnormal findings: Secondary | ICD-10-CM

## 2020-10-22 MED ORDER — ESTRADIOL 0.1 MG/GM VA CREA: TOPICAL_CREAM | VAGINAL | 1 refills | Status: DC

## 2020-10-22 MED ORDER — NYSTATIN 100000 UNIT/GM EX POWD: CUTANEOUS | 3 refills | Status: DC

## 2020-10-22 NOTE — Patient Instructions (Signed)

## 2020-10-28 ENCOUNTER — Ambulatory Visit
Admission: RE | Admit: 2020-10-28 | Discharge: 2020-10-28 | Disposition: A | Discharge status: Home / Self Care | Payer: BC Managed Care – PPO | Source: Ambulatory Visit | Attending: Obstetrics and Gynecology | Admitting: Obstetrics and Gynecology

## 2020-10-28 ENCOUNTER — Other Ambulatory Visit: Payer: Self-pay

## 2020-10-28 ENCOUNTER — Other Ambulatory Visit: Payer: Self-pay | Admitting: Family Medicine

## 2020-10-28 DIAGNOSIS — Z1231 Encounter for screening mammogram for malignant neoplasm of breast: Secondary | ICD-10-CM

## 2020-11-01 ENCOUNTER — Other Ambulatory Visit: Payer: Self-pay

## 2020-11-01 ENCOUNTER — Other Ambulatory Visit (INDEPENDENT_AMBULATORY_CARE_PROVIDER_SITE_OTHER): Payer: BC Managed Care – PPO

## 2020-11-01 DIAGNOSIS — E538 Deficiency of other specified B group vitamins: Secondary | ICD-10-CM

## 2020-11-01 DIAGNOSIS — R7301 Impaired fasting glucose: Secondary | ICD-10-CM

## 2020-11-01 DIAGNOSIS — Z0000 Encounter for general adult medical examination without abnormal findings: Secondary | ICD-10-CM

## 2020-11-01 DIAGNOSIS — E785 Hyperlipidemia, unspecified: Secondary | ICD-10-CM | POA: Diagnosis not present

## 2020-11-01 DIAGNOSIS — R7989 Other specified abnormal findings of blood chemistry: Secondary | ICD-10-CM | POA: Diagnosis not present

## 2020-11-01 LAB — COMPREHENSIVE METABOLIC PANEL
ALT: 19 U/L (ref 0–35)
AST: 19 U/L (ref 0–37)
Albumin: 4.3 g/dL (ref 3.5–5.2)
Alkaline Phosphatase: 56 U/L (ref 39–117)
BUN: 14 mg/dL (ref 6–23)
CO2: 30 mEq/L (ref 19–32)
Calcium: 9.3 mg/dL (ref 8.4–10.5)
Chloride: 101 mEq/L (ref 96–112)
Creatinine, Ser: 0.76 mg/dL (ref 0.40–1.20)
GFR: 85.98 mL/min (ref >60.00)
Glucose, Bld: 112 mg/dL — ABNORMAL HIGH (ref 70–99)
Potassium: 4.3 mEq/L (ref 3.5–5.1)
Sodium: 139 mEq/L (ref 135–145)
Total Bilirubin: 1 mg/dL (ref 0.2–1.2)
Total Protein: 6.8 g/dL (ref 6.0–8.3)

## 2020-11-01 LAB — CBC WITH DIFFERENTIAL/PLATELET
Basophils Absolute: 0 10*3/uL (ref 0.0–0.1)
Basophils Relative: 0.3 % (ref 0.0–3.0)
Eosinophils Absolute: 0.2 10*3/uL (ref 0.0–0.7)
Eosinophils Relative: 2.8 % (ref 0.0–5.0)
HCT: 39.7 % (ref 36.0–46.0)
Hemoglobin: 13.5 g/dL (ref 12.0–15.0)
Lymphocytes Relative: 39.1 % (ref 12.0–46.0)
Lymphs Abs: 2.1 10*3/uL (ref 0.7–4.0)
MCHC: 33.9 g/dL (ref 30.0–36.0)
MCV: 89.8 fl (ref 78.0–100.0)
Monocytes Absolute: 0.4 10*3/uL (ref 0.1–1.0)
Monocytes Relative: 7.6 % (ref 3.0–12.0)
Neutro Abs: 2.7 10*3/uL (ref 1.4–7.7)
Neutrophils Relative %: 50.2 % (ref 43.0–77.0)
Platelets: 225 10*3/uL (ref 150.0–400.0)
RBC: 4.42 Mil/uL (ref 3.87–5.11)
RDW: 13.2 % (ref 11.5–15.5)
WBC: 5.4 10*3/uL (ref 4.0–10.5)

## 2020-11-01 LAB — LIPID PANEL
Cholesterol: 168 mg/dL (ref 0–200)
HDL: 66.1 mg/dL (ref >39.00)
LDL Cholesterol: 87 mg/dL (ref 0–99)
NonHDL: 101.87
Total CHOL/HDL Ratio: 3
Triglycerides: 75 mg/dL (ref 0.0–149.0)
VLDL: 15 mg/dL (ref 0.0–40.0)

## 2020-11-01 LAB — VITAMIN D 25 HYDROXY (VIT D DEFICIENCY, FRACTURES): VITD: 36.59 ng/mL (ref 30.00–100.00)

## 2020-11-01 LAB — VITAMIN B12: Vitamin B-12: 850 pg/mL (ref 211–911)

## 2020-11-01 LAB — HEMOGLOBIN A1C: Hgb A1c MFr Bld: 6.1 % (ref 4.6–6.5)

## 2020-11-08 ENCOUNTER — Ambulatory Visit: Payer: BC Managed Care – PPO | Admitting: Family Medicine

## 2020-11-20 ENCOUNTER — Ambulatory Visit: Payer: BC Managed Care – PPO | Admitting: Family Medicine

## 2021-01-03 ENCOUNTER — Ambulatory Visit: Payer: BC Managed Care – PPO | Admitting: Family Medicine

## 2021-01-24 ENCOUNTER — Telehealth: Payer: Self-pay | Admitting: *Deleted

## 2021-01-24 NOTE — Telephone Encounter (Signed)
Patient called c/o vaginal infection requesting medication. Has tried OTC Monistat and no relief. I called patient back and left message on voicemail to schedule office visit for next for treatment.

## 2021-02-07 ENCOUNTER — Other Ambulatory Visit: Payer: Self-pay

## 2021-02-07 ENCOUNTER — Encounter: Payer: Self-pay | Admitting: Family Medicine

## 2021-02-07 ENCOUNTER — Ambulatory Visit: Payer: BC Managed Care – PPO | Admitting: Family Medicine

## 2021-02-07 VITALS — BP 104/72 | HR 76 | Temp 97.8°F | Ht 62.0 in | Wt 169.6 lb

## 2021-02-07 DIAGNOSIS — E785 Hyperlipidemia, unspecified: Secondary | ICD-10-CM | POA: Diagnosis not present

## 2021-02-07 DIAGNOSIS — G4700 Insomnia, unspecified: Secondary | ICD-10-CM

## 2021-02-07 DIAGNOSIS — G43909 Migraine, unspecified, not intractable, without status migrainosus: Secondary | ICD-10-CM

## 2021-02-07 DIAGNOSIS — F419 Anxiety disorder, unspecified: Secondary | ICD-10-CM | POA: Diagnosis not present

## 2021-02-07 DIAGNOSIS — E538 Deficiency of other specified B group vitamins: Secondary | ICD-10-CM | POA: Diagnosis not present

## 2021-02-07 MED ORDER — ELETRIPTAN HYDROBROMIDE 40 MG PO TABS: 40.0000 mg | ORAL_TABLET | ORAL | 5 refills | Status: DC | PRN

## 2021-02-07 MED ORDER — TRAZODONE HCL 50 MG PO TABS: 25.0000 mg | ORAL_TABLET | Freq: Every evening | ORAL | 1 refills | Status: DC | PRN

## 2021-02-07 NOTE — Progress Notes (Signed)
Maria Gross DOB: 1961-02-23 Encounter date: 02/07/2021  This is a 60 y.o. female who presents with Chief Complaint  Patient presents with   Follow-up   Migraine    Patient states Sumatriptan does not seem to help with migraines past 3 months, tried Excedrin migraine instead with some relief     History of present illness: Last visit with me was over a year ago for physical.  Husband had accident 3 years ago; still not very mobile. 3 years ago husband in yard and tripped on garden hose - snapped cervical spine. Laid outside for hours in 102 heat. Mail driver heard him eventually and called 911. Was in [place] long for weeks and without movement in legs for weeks. Finally had surgery for spinal stenosis and cervical spine compression. She has been complete care giver x 3 years (did initially have home health, PT). Can do more for self than he could 1 year ago, but a lot he can't do. Takes a toll on her. Once she is home from work; just harder. Hard to watch husband struggling to do things. Feels like she is good about being patient at home with him; but doesn't feel like she has as much patience at work. Just no break from work. No breaks since starting covid. Doesn't have time for self care. No outlet for stress. Had meeting with surgeon for husband 2 mo ago and they were told that he won't get much better from where he is at now. He is working hard to get better,and she is trying to help him with this.   Migraine: Sumatriptan works but not working like it did at first where it would knock it out quickly. Usually come on at 2am or wake her from sleep. Takes excedrin migraine to make it through day and then imitrex at night when she gets home. Used to take away headache right away in 30 min; now not going away and still hurting with trying to go to sleep. Does eventually stop; but not as quickly as before. Tried imitrex during day in past, but was fuzzy at work. She has been unable to tolerate  imitrex early when she has headache because then feels more sick; can't function. Getting them about once a month now. Sometimes will have 2/month.   Anxiety: Celexa 40 mg; hard to turn off brain at night. Does feel like regimen now is working well for her. Takes melatonin at 8, citalopram, benadryl at 8pm. Then she feels like she can fall asleep about 10. Usually wakes around 2. Never sleeps through night. Husband up every 1-2 hours to go to the bathroom which also wakes her up. She tried sleeping in a different room but husband has fallen and this worries her so she wants to be close to him.   Hyperlipidemia: Lipitor 40 mg Takes B12 for B12 deficiency Follows with Dr. Quincy Simmonds for gynecology.  She does have a pessary. Next colonoscopy due 12/2023. Mammogram completed 10/29/2020.   Allergies  Allergen Reactions   Ivp Dye [Iodinated Diagnostic Agents] Other (See Comments)    seizures   Current Meds  Medication Sig   atorvastatin (LIPITOR) 40 MG tablet TAKE 1 TABLET(40 MG) BY MOUTH AT BEDTIME   citalopram (CELEXA) 40 MG tablet TAKE 1 TABLET(40 MG) BY MOUTH AT BEDTIME   Cyanocobalamin (VITAMIN B-12 PO) Take 1 tablet by mouth every other day.    diphenhydrAMINE HCl (BENADRYL PO) Take by mouth.   eletriptan (RELPAX) 40 MG tablet Take 1  tablet (40 mg total) by mouth as needed for migraine or headache. May repeat in 2 hours if headache persists or recurs.   estradiol (ESTRACE) 0.1 MG/GM vaginal cream Use 1/2 g vaginally two or three times per week as needed to maintain symptom relief.   loratadine (CLARITIN) 10 MG tablet Take 10 mg by mouth daily.   MELATONIN PO Take by mouth.   Multiple Vitamin (MULTIVITAMIN) capsule Take 1 capsule by mouth daily.   nystatin (NYSTATIN) powder APPLY TOPICALLY TO THE AFFECTED AREA THREE TIMES DAILY FOR UP TO 7 DAYS   Polyethyl Glycol-Propyl Glycol 0.4-0.3 % SOLN Place 1 drop into both eyes 2 (two) times daily.   [DISCONTINUED] SUMAtriptan (IMITREX) 50 MG tablet TAKE  1 TABLET BY MOUTH DAILY AS NEEDED    Review of Systems  Constitutional:  Negative for chills, fatigue and fever.  Respiratory:  Negative for cough, chest tightness, shortness of breath and wheezing.   Cardiovascular:  Negative for chest pain, palpitations and leg swelling.  Psychiatric/Behavioral:  Positive for sleep disturbance (hard to relax to fall asleep; just hard to stop thinking sometimes). The patient is not nervous/anxious.        Just feels stressed; working all the time; no break from work.   Objective:  BP 104/72 (BP Location: Left Arm, Patient Position: Sitting, Cuff Size: Large)   Pulse 76   Temp 97.8 F (36.6 C) (Oral)   Ht 5\' 2"  (1.575 m)   Wt 169 lb 9.6 oz (76.9 kg)   SpO2 96%   BMI 31.02 kg/m   Weight: 169 lb 9.6 oz (76.9 kg)   BP Readings from Last 3 Encounters:  02/07/21 104/72  10/22/20 130/74  01/24/20 108/80   Wt Readings from Last 3 Encounters:  02/07/21 169 lb 9.6 oz (76.9 kg)  10/22/20 168 lb (76.2 kg)  01/24/20 170 lb 1.6 oz (77.2 kg)    Physical Exam Constitutional:      General: She is not in acute distress.    Appearance: She is well-developed.  HENT:     Head: Normocephalic and atraumatic.     Right Ear: External ear normal.     Left Ear: External ear normal.     Mouth/Throat:     Pharynx: No oropharyngeal exudate.  Eyes:     Conjunctiva/sclera: Conjunctivae normal.     Pupils: Pupils are equal, round, and reactive to light.  Neck:     Thyroid: No thyromegaly.  Cardiovascular:     Rate and Rhythm: Normal rate and regular rhythm.     Heart sounds: Normal heart sounds. No murmur heard.   No friction rub. No gallop.  Pulmonary:     Effort: Pulmonary effort is normal.     Breath sounds: Normal breath sounds.  Abdominal:     General: Bowel sounds are normal. There is no distension.     Palpations: Abdomen is soft. There is no mass.     Tenderness: There is no abdominal tenderness. There is no guarding.     Hernia: No hernia is  present.  Musculoskeletal:        General: No tenderness or deformity. Normal range of motion.     Cervical back: Normal range of motion and neck supple.  Lymphadenopathy:     Cervical: No cervical adenopathy.  Skin:    General: Skin is warm and dry.     Findings: No rash.  Neurological:     Mental Status: She is alert and oriented to person, place, and  time.     Deep Tendon Reflexes: Reflexes normal.     Reflex Scores:      Tricep reflexes are 2+ on the right side and 2+ on the left side.      Bicep reflexes are 2+ on the right side and 2+ on the left side.      Brachioradialis reflexes are 2+ on the right side and 2+ on the left side.      Patellar reflexes are 2+ on the right side and 2+ on the left side. Psychiatric:        Speech: Speech normal.        Behavior: Behavior normal.        Thought Content: Thought content normal.    Assessment/Plan   1. Migraine without status migrainosus, not intractable, unspecified migraine type We discussed goal with treatment of migraine is to abort within 30 minutes and have med that is tolerable and not causing drowsiness that is excessive. We are going to try relpax. Sumatriptan was sedating even at 50mg  dose so I do not feel increase in this will be helpful to keep her functional/treat migraine. Consider non-triptan for treatment.   2. Hyperlipidemia, unspecified hyperlipidemia type Continue with lipitor 40mg . We reviewed bloodwork together today.  3. Anxiety She is doing well overall with celexa 40mg ; just increased stress with work and with not getting break from her work. We discussed looking for alternative work options that would not keep her away from home/family so much and be so physically and mentally draining for her.   4. B12 deficiency Continue with supplementation. Has been stable.   5. Insomnia, unspecified type We are going to add trazodone for her to try for sleep. Discussed new medication(s) today with patient.  Discussed potential side effects and patient verbalized understanding. She has taken before and feels it helped; but she did not take long term and did not try different doses.   Return in about 6 months (around 08/10/2021) for physical exam. Time spent with patient discussing current stressors; working on healthy lifestyle behaviors like exercise, but also limitations with current work. We discussed work life balance; and how lack of balance is affecting mood overall. We discussed potentials for sleep treatment help and additional/changes in treatment for mood. Additionally exam time, charting time to total 45 minutes.    Micheline Rough, MD

## 2021-03-18 ENCOUNTER — Other Ambulatory Visit: Payer: Self-pay | Admitting: Family Medicine

## 2021-05-21 ENCOUNTER — Other Ambulatory Visit: Payer: Self-pay | Admitting: Family Medicine

## 2021-08-15 ENCOUNTER — Encounter: Payer: Self-pay | Admitting: Family Medicine

## 2021-08-15 ENCOUNTER — Ambulatory Visit (INDEPENDENT_AMBULATORY_CARE_PROVIDER_SITE_OTHER): Payer: BC Managed Care – PPO | Admitting: Family Medicine

## 2021-08-15 VITALS — BP 112/60 | HR 93 | Temp 98.4°F | Ht 62.0 in | Wt 176.5 lb

## 2021-08-15 DIAGNOSIS — F5101 Primary insomnia: Secondary | ICD-10-CM | POA: Diagnosis not present

## 2021-08-15 DIAGNOSIS — E538 Deficiency of other specified B group vitamins: Secondary | ICD-10-CM

## 2021-08-15 DIAGNOSIS — E785 Hyperlipidemia, unspecified: Secondary | ICD-10-CM | POA: Diagnosis not present

## 2021-08-15 DIAGNOSIS — R7989 Other specified abnormal findings of blood chemistry: Secondary | ICD-10-CM

## 2021-08-15 DIAGNOSIS — G43909 Migraine, unspecified, not intractable, without status migrainosus: Secondary | ICD-10-CM

## 2021-08-15 DIAGNOSIS — R7301 Impaired fasting glucose: Secondary | ICD-10-CM | POA: Diagnosis not present

## 2021-08-15 DIAGNOSIS — F419 Anxiety disorder, unspecified: Secondary | ICD-10-CM

## 2021-08-15 DIAGNOSIS — Z0000 Encounter for general adult medical examination without abnormal findings: Secondary | ICD-10-CM

## 2021-08-15 LAB — LIPID PANEL
Cholesterol: 163 mg/dL (ref 0–200)
HDL: 69.5 mg/dL (ref >39.00)
LDL Cholesterol: 73 mg/dL (ref 0–99)
NonHDL: 93.21
Total CHOL/HDL Ratio: 2
Triglycerides: 99 mg/dL (ref 0.0–149.0)
VLDL: 19.8 mg/dL (ref 0.0–40.0)

## 2021-08-15 LAB — CBC WITH DIFFERENTIAL/PLATELET
Basophils Absolute: 0 10*3/uL (ref 0.0–0.1)
Basophils Relative: 0.4 % (ref 0.0–3.0)
Eosinophils Absolute: 0.2 10*3/uL (ref 0.0–0.7)
Eosinophils Relative: 2.8 % (ref 0.0–5.0)
HCT: 40.9 % (ref 36.0–46.0)
Hemoglobin: 13.4 g/dL (ref 12.0–15.0)
Lymphocytes Relative: 32.6 % (ref 12.0–46.0)
Lymphs Abs: 1.9 10*3/uL (ref 0.7–4.0)
MCHC: 32.8 g/dL (ref 30.0–36.0)
MCV: 91 fl (ref 78.0–100.0)
Monocytes Absolute: 0.4 10*3/uL (ref 0.1–1.0)
Monocytes Relative: 7.1 % (ref 3.0–12.0)
Neutro Abs: 3.4 10*3/uL (ref 1.4–7.7)
Neutrophils Relative %: 57.1 % (ref 43.0–77.0)
Platelets: 245 10*3/uL (ref 150.0–400.0)
RBC: 4.49 Mil/uL (ref 3.87–5.11)
RDW: 13.2 % (ref 11.5–15.5)
WBC: 5.9 10*3/uL (ref 4.0–10.5)

## 2021-08-15 LAB — HEMOGLOBIN A1C: Hgb A1c MFr Bld: 6.2 % (ref 4.6–6.5)

## 2021-08-15 LAB — COMPREHENSIVE METABOLIC PANEL
ALT: 19 U/L (ref 0–35)
AST: 20 U/L (ref 0–37)
Albumin: 4.5 g/dL (ref 3.5–5.2)
Alkaline Phosphatase: 60 U/L (ref 39–117)
BUN: 13 mg/dL (ref 6–23)
CO2: 33 mEq/L — ABNORMAL HIGH (ref 19–32)
Calcium: 9.4 mg/dL (ref 8.4–10.5)
Chloride: 103 mEq/L (ref 96–112)
Creatinine, Ser: 0.79 mg/dL (ref 0.40–1.20)
GFR: 81.62 mL/min (ref >60.00)
Glucose, Bld: 83 mg/dL (ref 70–99)
Potassium: 4 mEq/L (ref 3.5–5.1)
Sodium: 141 mEq/L (ref 135–145)
Total Bilirubin: 0.7 mg/dL (ref 0.2–1.2)
Total Protein: 7.4 g/dL (ref 6.0–8.3)

## 2021-08-15 LAB — VITAMIN D 25 HYDROXY (VIT D DEFICIENCY, FRACTURES): VITD: 44.78 ng/mL (ref 30.00–100.00)

## 2021-08-15 LAB — VITAMIN B12: Vitamin B-12: 786 pg/mL (ref 211–911)

## 2021-08-15 LAB — FOLATE: Folate: >24.2 ng/mL (ref >5.9)

## 2021-08-15 MED ORDER — BELSOMRA 10 MG PO TABS: 10.0000 mg | ORAL_TABLET | Freq: Every evening | ORAL | 2 refills | Status: DC | PRN

## 2021-08-15 NOTE — Progress Notes (Signed)
Maria Gross DOB: 1961-05-15 Encounter date: 08/15/2021  This is a 61 y.o. female who presents for complete physical   History of present illness/Additional concerns:  Feeling much better than her last visit. Got new GM at work and she is much better. This has really helped with her happiness. Working on work life balance with this Psychologist, educational. Husband is about the same; but walking a bit better (since visiting [employer]). He is able to be a little more active around the house.   She tried taking medication for 3 months (trazodone) but this didn't work. Now doing melatonin, benadryl, tylenol pm. Even tried to increase the trazodone. Takes at least an hour and a half after taking those meds for her to go to sleep. Still waking through the night. Can fall right back asleep now, which is improved.she wanted to stay away from controlled substances (although alprazolam worked for her in the past)  She is still taking the celexa 40mg .   Hyperlipidemia: Lipitor 40 mg Takes B12 for B12 deficiency Follows with Dr. Quincy Simmonds for gynecology.  She does have a pessary. Patient states she had complete skin exam at that visit as well.  Next colonoscopy due 12/2023. Mammogram completed 10/29/2020.   PHQ9 SCORE ONLY 02/07/2021 01/24/2020 01/24/2020  PHQ-9 Total Score 10 0 0     Past Medical History:  Diagnosis Date   Abnormal Pap smear of cervix    --age 7 had precancerous changes on pap and had hysterectomy   Alopecia    Anemia    Anxiety    Diverticulitis    Endometriosis    Family history of kidney cancer    Family history of ovarian cancer    Family history of prostate cancer    Family history of uterine cancer    High cholesterol    Migraines    Renal stones    Uterine cancer ([place])    dx at 5   Past Surgical History:  Procedure Laterality Date   ABDOMINAL HYSTERECTOMY     L-TAH--ovaries remain   left kidney blockage Left    age [age], and age 55   TONSILLECTOMY AND ADENOIDECTOMY      -age 35   urethral stent Left    age 65   Allergies  Allergen Reactions   Ivp Dye [Iodinated Contrast Media] Other (See Comments)    seizures   Current Meds  Medication Sig   atorvastatin (LIPITOR) 40 MG tablet TAKE 1 TABLET(40 MG) BY MOUTH AT BEDTIME   citalopram (CELEXA) 40 MG tablet TAKE 1 TABLET(40 MG) BY MOUTH AT BEDTIME   Cyanocobalamin (VITAMIN B-12 PO) Take 1 tablet by mouth every other day.    diphenhydrAMINE HCl (BENADRYL PO) Take by mouth.   eletriptan (RELPAX) 40 MG tablet Take 1 tablet (40 mg total) by mouth as needed for migraine or headache. May repeat in 2 hours if headache persists or recurs.   estradiol (ESTRACE) 0.1 MG/GM vaginal cream Use 1/2 g vaginally two or three times per week as needed to maintain symptom relief.   loratadine (CLARITIN) 10 MG tablet Take 10 mg by mouth daily.   MELATONIN PO Take by mouth.   Multiple Vitamin (MULTIVITAMIN) capsule Take 1 capsule by mouth daily.   nystatin (NYSTATIN) powder APPLY TOPICALLY TO THE AFFECTED AREA THREE TIMES DAILY FOR UP TO 7 DAYS   Polyethyl Glycol-Propyl Glycol 0.4-0.3 % SOLN Place 1 drop into both eyes 2 (two) times daily.   Suvorexant (BELSOMRA) 10 MG  TABS Take 10 mg by mouth at bedtime as needed.   Social History   Tobacco Use   Smoking status: Never   Smokeless tobacco: Never  Substance Use Topics   Alcohol use: Not Currently    Alcohol/week: 0.0 standard drinks   Family History  Problem Relation Age of Onset   Diabetes Mother 28   Other Mother        passed away from a blood clot after sinus surgery   Migraines Mother    COPD Father 37   Diabetes Father    Diabetes Sister    Cancer Maternal Grandfather 65       dec metastatic prostate ca   Leukemia Paternal Aunt    Cancer Cousin        uterine and ovarian   Cancer Cousin        uterine and ovarian   Kidney cancer Paternal Uncle        dx in his late 80s-30s   Glomerulonephritis Paternal Uncle      Review of Systems   Constitutional:  Negative for activity change, appetite change, chills, fatigue, fever and unexpected weight change.  HENT:  Negative for congestion, ear pain, hearing loss, sinus pressure, sinus pain, sore throat and trouble swallowing.   Eyes:  Negative for pain and visual disturbance.  Respiratory:  Negative for cough, chest tightness, shortness of breath and wheezing.   Cardiovascular:  Negative for chest pain, palpitations and leg swelling.  Gastrointestinal:  Negative for abdominal pain, blood in stool, constipation, diarrhea, nausea and vomiting.  Genitourinary:  Negative for difficulty urinating and menstrual problem.  Musculoskeletal:  Negative for arthralgias and back pain.  Skin:  Negative for rash.  Neurological:  Negative for dizziness, weakness, numbness and headaches.  Hematological:  Negative for adenopathy. Does not bruise/bleed easily.  Psychiatric/Behavioral:  Positive for sleep disturbance (see hpi). Negative for suicidal ideas. The patient is not nervous/anxious.    CBC:  Lab Results  Component Value Date   WBC 5.4 11/01/2020   HGB 13.5 11/01/2020   HGB 13.1 11/21/2019   HCT 39.7 11/01/2020   HCT 39.4 11/21/2019   MCH 30.2 11/21/2019   MCH 30.0 12/26/2013   MCHC 33.9 11/01/2020   RDW 13.2 11/01/2020   RDW 12.6 11/21/2019   PLT 225.0 11/01/2020   PLT 270 11/21/2019   CMP: Lab Results  Component Value Date   NA 139 11/01/2020   NA 143 11/21/2019   K 4.3 11/01/2020   CL 101 11/01/2020   CO2 30 11/01/2020   GLUCOSE 112 (H) 11/01/2020   BUN 14 11/01/2020   BUN 16 11/21/2019   CREATININE 0.76 11/01/2020   LABGLOB 2.3 11/21/2019   GFRAA 105 11/21/2019   CALCIUM 9.3 11/01/2020   PROT 6.8 11/01/2020   PROT 7.1 11/21/2019   AGRATIO 2.1 11/21/2019   BILITOT 1.0 11/01/2020   BILITOT 0.7 11/21/2019   ALKPHOS 56 11/01/2020   ALT 19 11/01/2020   AST 19 11/01/2020   LIPID: Lab Results  Component Value Date   CHOL 168 11/01/2020   TRIG 75.0 11/01/2020    HDL 66.10 11/01/2020   LDLCALC 87 11/01/2020    Objective:  BP 112/60 (BP Location: Left Arm, Patient Position: Sitting, Cuff Size: Large)    Pulse 93    Temp 98.4 F (36.9 C) (Oral)    Ht 5\' 2"  (1.575 m)    Wt 176 lb 8 oz (80.1 kg)    SpO2 98%    BMI 32.28  kg/m   Weight: 176 lb 8 oz (80.1 kg)   BP Readings from Last 3 Encounters:  08/15/21 112/60  02/07/21 104/72  10/22/20 130/74   Wt Readings from Last 3 Encounters:  08/15/21 176 lb 8 oz (80.1 kg)  02/07/21 169 lb 9.6 oz (76.9 kg)  10/22/20 168 lb (76.2 kg)    Physical Exam Constitutional:      General: She is not in acute distress.    Appearance: She is well-developed.  HENT:     Head: Normocephalic and atraumatic.     Right Ear: External ear normal.     Left Ear: External ear normal.     Mouth/Throat:     Pharynx: No oropharyngeal exudate.  Eyes:     Conjunctiva/sclera: Conjunctivae normal.     Pupils: Pupils are equal, round, and reactive to light.  Neck:     Thyroid: No thyromegaly.  Cardiovascular:     Rate and Rhythm: Normal rate and regular rhythm.     Heart sounds: Normal heart sounds. No murmur heard.   No friction rub. No gallop.  Pulmonary:     Effort: Pulmonary effort is normal.     Breath sounds: Normal breath sounds.  Abdominal:     General: Bowel sounds are normal. There is no distension.     Palpations: Abdomen is soft. There is no mass.     Tenderness: There is no abdominal tenderness. There is no guarding.     Hernia: No hernia is present.  Musculoskeletal:        General: No tenderness or deformity. Normal range of motion.     Cervical back: Normal range of motion and neck supple.  Lymphadenopathy:     Cervical: No cervical adenopathy.  Skin:    General: Skin is warm and dry.     Findings: No rash.  Neurological:     Mental Status: She is alert and oriented to person, place, and time.     Deep Tendon Reflexes: Reflexes normal.     Reflex Scores:      Tricep reflexes are 2+ on the right  side and 2+ on the left side.      Bicep reflexes are 2+ on the right side and 2+ on the left side.      Brachioradialis reflexes are 2+ on the right side and 2+ on the left side.      Patellar reflexes are 2+ on the right side and 2+ on the left side. Psychiatric:        Speech: Speech normal.        Behavior: Behavior normal.        Thought Content: Thought content normal.    Assessment/Plan: There are no preventive care reminders to display for this patient. Health Maintenance reviewed - she is up to date.   1. Preventative health care Work on adding in some regular exercise.   2. Primary insomnia Trial belsomra. - Suvorexant (BELSOMRA) 10 MG TABS; Take 10 mg by mouth at bedtime as needed.  Dispense: 30 tablet; Refill: 2  3. Hyperlipidemia, unspecified hyperlipidemia type Lipitor 40mg ; recheck labs today - Comprehensive metabolic panel; Future - Lipid panel; Future  4. Anxiety Well controlled on celexa 40mg . She is at better place now.   5. Migraine without status migrainosus, not intractable, unspecified migraine type Relpax if needed. This works well for her. If she takes the medication she can still function and continue with work.   6. B12 deficiency She takes supplement every  other day.  - CBC with Differential/Platelet; Future - Vitamin B12; Future - Folate; Future  7. Impaired fasting glucose - Hemoglobin A1c; Future  8. Low vitamin D level - VITAMIN D 25 Hydroxy (Vit-D Deficiency, Fractures); Future   Return in about 6 months (around 02/12/2022) for Chronic condition visit.  Micheline Rough, MD

## 2021-08-24 ENCOUNTER — Other Ambulatory Visit: Payer: Self-pay | Admitting: Family Medicine

## 2021-08-25 NOTE — Progress Notes (Signed)
GYNECOLOGY  VISIT   HPI: 61 y.o.   Married  Caucasian  female   G1P1001 with No LMP recorded. Patient has had a hysterectomy.   here for seeing occasional blood with pessary cleaning last week and having some pelvic discomfort.  She feels pessary is falling down and has to push it up when she is voiding and having a BM. Her pessary is a #3 ring with support, and it is currently out.   She cleans the pessary weekly and leaves it out for a short period of time.  Using vaginal estrogen cream 1/2 gram once weekly.   Patient complaining of dysuria. Has not paid attention to if she has blood in her urine.   No fever, shakes, chills.  No nausea or vomiting.  Left side of her abdomen is more painful than right side.  Some low back discomfort.   Last UTI was in April, 2021.   Reports feeling exhausted.  PCP prescribing sleeping aide.  GYNECOLOGIC HISTORY: No LMP recorded. Patient has had a hysterectomy. Contraception:  Hyst Menopausal hormone therapy:  Estrace cream Last mammogram:  10-28-20 Neg/Birads1 Last pap smear:   07-12-19 Neg:Neg HR HPV, 05-13-18 Neg:Neg HR HPV, 03-19-17 Neg:Neg HR HPV        OB History     Gravida  1   Para  1   Term  1   Preterm      AB      Living  1      SAB      IAB      Ectopic      Multiple      Live Births                 Patient Active Problem List   Diagnosis Date Noted   Hyperlipidemia 05/04/2019   Migraines    Anxiety    Genetic testing 05/26/2017   Family history of uterine cancer    Family history of ovarian cancer    Family history of kidney cancer    Family history of prostate cancer    Uterine cancer ([place])     Past Medical History:  Diagnosis Date   Abnormal Pap smear of cervix    --age 38 had precancerous changes on pap and had hysterectomy   Alopecia    Anemia    Anxiety    Diverticulitis    Endometriosis    Family history of kidney cancer    Family history of ovarian cancer    Family history of  prostate cancer    Family history of uterine cancer    High cholesterol    Migraines    Renal stones    Uterine cancer ([place])    dx at 30    Past Surgical History:  Procedure Laterality Date   ABDOMINAL HYSTERECTOMY     L-TAH--ovaries remain   left kidney blockage Left    age [age], and age 54   TONSILLECTOMY AND ADENOIDECTOMY     -age 41   urethral stent Left    age 36    Current Outpatient Medications  Medication Sig Dispense Refill   atorvastatin (LIPITOR) 40 MG tablet TAKE 1 TABLET(40 MG) BY MOUTH AT BEDTIME 90 tablet 1   citalopram (CELEXA) 40 MG tablet TAKE 1 TABLET(40 MG) BY MOUTH AT BEDTIME 90 tablet 1   Cyanocobalamin (VITAMIN B-12 PO) Take 1 tablet by mouth every other day.      eletriptan (RELPAX) 40 MG tablet Take 1 tablet (  40 mg total) by mouth as needed for migraine or headache. May repeat in 2 hours if headache persists or recurs. 10 tablet 5   estradiol (ESTRACE) 0.1 MG/GM vaginal cream Use 1/2 g vaginally two or three times per week as needed to maintain symptom relief. 42.5 g 1   loratadine (CLARITIN) 10 MG tablet Take 10 mg by mouth daily.     MELATONIN PO Take by mouth.     Multiple Vitamin (MULTIVITAMIN) capsule Take 1 capsule by mouth daily.     nystatin (NYSTATIN) powder APPLY TOPICALLY TO THE AFFECTED AREA THREE TIMES DAILY FOR UP TO 7 DAYS 30 g 3   Polyethyl Glycol-Propyl Glycol 0.4-0.3 % SOLN Place 1 drop into both eyes 2 (two) times daily.     sulfamethoxazole-trimethoprim (BACTRIM DS) 800-160 MG tablet Take 1 tablet by mouth 2 (two) times daily. One PO BID x 3 days 6 tablet 0   Suvorexant (BELSOMRA) 10 MG TABS Take 10 mg by mouth at bedtime as needed. 30 tablet 2   No current facility-administered medications for this visit.     ALLERGIES: Ivp dye [iodinated contrast media]  Family History  Problem Relation Age of Onset   Diabetes Mother 76   Other Mother        passed away from a blood clot after sinus surgery   Migraines Mother    COPD Father  8   Diabetes Father    Diabetes Sister    Cancer Maternal Grandfather 41       dec metastatic prostate ca   Leukemia Paternal Aunt    Cancer Cousin        uterine and ovarian   Cancer Cousin        uterine and ovarian   Kidney cancer Paternal Uncle        dx in his late 22s-30s   Glomerulonephritis Paternal Uncle     Social History   Socioeconomic History   Marital status: Married    Spouse name: Not on file   Number of children: Not on file   Years of education: Not on file   Highest education level: Not on file  Occupational History   Not on file  Tobacco Use   Smoking status: Never   Smokeless tobacco: Never  Vaping Use   Vaping Use: Never used  Substance and Sexual Activity   Alcohol use: Not Currently    Alcohol/week: 0.0 standard drinks   Drug use: No   Sexual activity: Not Currently    Partners: Male    Comment: Hyst  Other Topics Concern   Not on file  Social History Narrative   Not on file   Social Determinants of Health   Financial Resource Strain: Not on file  Food Insecurity: Not on file  Transportation Needs: Not on file  Physical Activity: Not on file  Stress: Not on file  Social Connections: Not on file  Intimate Partner Violence: Not on file    Review of Systems  Genitourinary:  Positive for dysuria and pelvic pain (more discomfort than pain).  All other systems reviewed and are negative.  PHYSICAL EXAMINATION:    BP 114/66    Pulse 92    SpO2 97%     General appearance: alert, cooperative and appears stated age  Pelvic: External genitalia:  no lesions              Urethra:  normal appearing urethra with no masses, tenderness or lesions  Bartholins and Skenes: normal                 Vagina: normal appearing vagina with normal color and discharge, no lesions              Cervix: absent.  Minimal erythema of the right vaginal cuff.  No ulceration.                 Bimanual Exam:  Uterus:  absent.                Adnexa: no  mass, fullness, tenderness            Chaperone was present for exam:  Estill Bamberg, CMA  ASSESSMENT  Status post hysterectomy.  Ovaries remain.  Hx diverticulitis.  Dysuria.  Rectocele almost third degree.  Minor erythema right apex.   PLAN  Urinalysis:  0 - 5 WBC, 0 - 2 RBC, moderate bacteria, 6 - 10 squams. UC sent.  Bactrim DS po bid x 3 days.  Rx for Diflucan 150 mg po x 1.  May repeat in 72 hours prn.  I encouraged use of the estradiol cream 1/2 gram pv at hs two to three times per week.  Consider leaving the pessary out overnight.  Return in 10 - 14 days for fitting of a larger pessary, Number 4 ring with support.    An After Visit Summary was printed and given to the patient.  32 min  total time was spent for this patient encounter, including preparation, face-to-face counseling with the patient, coordination of care, and documentation of the encounter.

## 2021-08-26 ENCOUNTER — Ambulatory Visit (INDEPENDENT_AMBULATORY_CARE_PROVIDER_SITE_OTHER): Payer: BC Managed Care – PPO | Admitting: Obstetrics and Gynecology

## 2021-08-26 ENCOUNTER — Encounter: Payer: Self-pay | Admitting: Obstetrics and Gynecology

## 2021-08-26 ENCOUNTER — Other Ambulatory Visit: Payer: Self-pay

## 2021-08-26 VITALS — BP 114/66 | HR 92

## 2021-08-26 DIAGNOSIS — Z4689 Encounter for fitting and adjustment of other specified devices: Secondary | ICD-10-CM | POA: Diagnosis not present

## 2021-08-26 DIAGNOSIS — R300 Dysuria: Secondary | ICD-10-CM | POA: Diagnosis not present

## 2021-08-26 MED ORDER — FLUCONAZOLE 150 MG PO TABS: 150.0000 mg | ORAL_TABLET | Freq: Once | ORAL | 0 refills | Status: AC

## 2021-08-26 MED ORDER — SULFAMETHOXAZOLE-TRIMETHOPRIM 800-160 MG PO TABS: 1.0000 | ORAL_TABLET | Freq: Two times a day (BID) | ORAL | 0 refills | Status: DC

## 2021-08-28 LAB — URINALYSIS, COMPLETE W/RFL CULTURE
Bilirubin Urine: NEGATIVE
Glucose, UA: NEGATIVE
Hyaline Cast: NONE SEEN /LPF
Ketones, ur: NEGATIVE
Nitrites, Initial: NEGATIVE
Protein, ur: NEGATIVE
Specific Gravity, Urine: 1.01 (ref 1.001–1.035)
pH: 5.5 (ref 5.0–8.0)

## 2021-08-28 LAB — URINE CULTURE
MICRO NUMBER:: 12943274
Result:: NO GROWTH
SPECIMEN QUALITY:: ADEQUATE

## 2021-08-28 LAB — CULTURE INDICATED

## 2021-09-08 ENCOUNTER — Other Ambulatory Visit: Payer: Self-pay

## 2021-09-08 ENCOUNTER — Ambulatory Visit: Payer: BC Managed Care – PPO | Admitting: Obstetrics and Gynecology

## 2021-09-08 ENCOUNTER — Encounter: Payer: Self-pay | Admitting: Obstetrics and Gynecology

## 2021-09-08 VITALS — BP 122/62 | HR 79 | Ht 62.0 in

## 2021-09-08 DIAGNOSIS — N816 Rectocele: Secondary | ICD-10-CM | POA: Diagnosis not present

## 2021-09-08 DIAGNOSIS — R32 Unspecified urinary incontinence: Secondary | ICD-10-CM

## 2021-09-08 NOTE — Progress Notes (Signed)
GYNECOLOGY  VISIT   HPI: 61 y.o.   Married  Caucasian  female   G1P1001 with No LMP recorded. Patient has had a hysterectomy.   here for follow up and fitting of possible larger pessary.  She has a #3 ring with support and feels like it is too small.  She left her pessary out today.  Asking what the bulge is that she feels in the vagina.  Usually removes her pessary and uses vaginal estrogen cream three times a week.   She does have some urinary incontinence.   Not sexually active.  UC 08/26/21, negative for infection.   GYNECOLOGIC HISTORY: No LMP recorded. Patient has had a hysterectomy. Contraception:  Hyst Menopausal hormone therapy:  Estrace Cream Last mammogram:  10-28-20 Neg/Birads1 Last pap smear:   07-12-19 Neg:Neg HR HPV, 05-13-18 Neg:Neg HR HPV, 03-19-17 Neg:Neg HR HPV        OB History     Gravida  1   Para  1   Term  1   Preterm      AB      Living  1      SAB      IAB      Ectopic      Multiple      Live Births                 Patient Active Problem List   Diagnosis Date Noted   Hyperlipidemia 05/04/2019   Migraines    Anxiety    Genetic testing 05/26/2017   Family history of uterine cancer    Family history of ovarian cancer    Family history of kidney cancer    Family history of prostate cancer    Uterine cancer ([place])     Past Medical History:  Diagnosis Date   Abnormal Pap smear of cervix    --age 32 had precancerous changes on pap and had hysterectomy   Alopecia    Anemia    Anxiety    Diverticulitis    Endometriosis    Family history of kidney cancer    Family history of ovarian cancer    Family history of prostate cancer    Family history of uterine cancer    High cholesterol    Migraines    Renal stones    Uterine cancer ([place])    dx at 89    Past Surgical History:  Procedure Laterality Date   ABDOMINAL HYSTERECTOMY     L-TAH--ovaries remain   left kidney blockage Left    age [age], and age 71    TONSILLECTOMY AND ADENOIDECTOMY     -age 73   urethral stent Left    age 55    Current Outpatient Medications  Medication Sig Dispense Refill   atorvastatin (LIPITOR) 40 MG tablet TAKE 1 TABLET(40 MG) BY MOUTH AT BEDTIME 90 tablet 1   Calcium Carb-Cholecalciferol (CALCIUM 1000 + D PO)      citalopram (CELEXA) 40 MG tablet TAKE 1 TABLET(40 MG) BY MOUTH AT BEDTIME 90 tablet 1   Cyanocobalamin (VITAMIN B-12 PO) Take 1 tablet by mouth every other day.      eletriptan (RELPAX) 40 MG tablet Take 1 tablet (40 mg total) by mouth as needed for migraine or headache. May repeat in 2 hours if headache persists or recurs. 10 tablet 5   estradiol (ESTRACE) 0.1 MG/GM vaginal cream Use 1/2 g vaginally two or three times per week as needed to maintain symptom relief. 42.5  g 1   Hydrocortisone Acetate (VAGISIL) 1 % CREA Apply topically.     loratadine (CLARITIN) 10 MG tablet Take 10 mg by mouth daily.     MELATONIN PO Take by mouth.     Multiple Vitamin (MULTIVITAMIN) capsule Take 1 capsule by mouth daily.     nystatin (NYSTATIN) powder APPLY TOPICALLY TO THE AFFECTED AREA THREE TIMES DAILY FOR UP TO 7 DAYS 30 g 3   Polyethyl Glycol-Propyl Glycol 0.4-0.3 % SOLN Place 1 drop into both eyes 2 (two) times daily.     Suvorexant (BELSOMRA) 10 MG TABS Take 10 mg by mouth at bedtime as needed. 30 tablet 2   No current facility-administered medications for this visit.     ALLERGIES: Ivp dye [iodinated contrast media]  Family History  Problem Relation Age of Onset   Diabetes Mother 56   Other Mother        passed away from a blood clot after sinus surgery   Migraines Mother    COPD Father 35   Diabetes Father    Diabetes Sister    Cancer Maternal Grandfather 32       dec metastatic prostate ca   Leukemia Paternal Aunt    Cancer Cousin        uterine and ovarian   Cancer Cousin        uterine and ovarian   Kidney cancer Paternal Uncle        dx in his late 31s-30s   Glomerulonephritis Paternal Uncle      Social History   Socioeconomic History   Marital status: Married    Spouse name: Not on file   Number of children: Not on file   Years of education: Not on file   Highest education level: Not on file  Occupational History   Not on file  Tobacco Use   Smoking status: Never   Smokeless tobacco: Never  Vaping Use   Vaping Use: Never used  Substance and Sexual Activity   Alcohol use: Not Currently    Alcohol/week: 0.0 standard drinks   Drug use: No   Sexual activity: Not Currently    Partners: Male    Comment: Hyst  Other Topics Concern   Not on file  Social History Narrative   Not on file   Social Determinants of Health   Financial Resource Strain: Not on file  Food Insecurity: Not on file  Transportation Needs: Not on file  Physical Activity: Not on file  Stress: Not on file  Social Connections: Not on file  Intimate Partner Violence: Not on file    Review of Systems  All other systems reviewed and are negative.  PHYSICAL EXAMINATION:    BP 122/62    Pulse 79    Ht 5\' 2"  (1.575 m)    SpO2 96%    BMI 32.28 kg/m     General appearance: alert, cooperative and appears stated age   Pelvic: External genitalia:  no lesions              Urethra:  normal appearing urethra with no masses, tenderness or lesions.  Second degree rectocele.                Bimanual Exam:  Uterus:  absent              Adnexa: no mass, fullness, tenderness         Number 4 ring with support and an incontinence dish 70 mm, 2 3/4 inches tried  with patient.  She prefers the incontinence dish.   She walked around the office, did physical maneuvers, and sat on the commode and was able to maintain the incontinence dish.  The fitting pessaries were removed.   Chaperone was present for exam:  Estill Bamberg, CMA  ASSESSMENT  Rectocele.  Urinary incontinence.   PLAN  Patient fitted and given an incontinence dish, 2 3/4 inches, 70 mm.  She will practice placing it and removing it at home.  FU in  2 - 3 weeks, sooner as needed.    An After Visit Summary was printed and given to the patient.  20 min  total time was spent for this patient encounter, including preparation, face-to-face counseling with the patient, coordination of care, and documentation of the encounter.

## 2021-09-08 NOTE — Patient Instructions (Signed)

## 2021-09-15 NOTE — Progress Notes (Deleted)
GYNECOLOGY  VISIT ?  ?HPI: ?61 y.o.   Married  Caucasian  female   ?G1P1001 with No LMP recorded. Patient has had a hysterectomy.   ?here for recheck.  ? ?GYNECOLOGIC HISTORY: ?No LMP recorded. Patient has had a hysterectomy. ?Contraception: Hyst ?Menopausal hormone therapy: Estrace Cream  ?Last mammogram: 10-28-20 Neg/Birads1 ?Last pap smear: 07-12-19 Neg:Neg HR HPV, 05-13-18 Neg:Neg HR HPV, 03-19-17 Neg:Neg HR HPV ?       ?OB History   ? ? Gravida  ?1  ? Para  ?1  ? Term  ?1  ? Preterm  ?   ? AB  ?   ? Living  ?1  ?  ? ? SAB  ?   ? IAB  ?   ? Ectopic  ?   ? Multiple  ?   ? Live Births  ?   ?   ?  ?  ?    ? ?Patient Active Problem List  ? Diagnosis Date Noted  ? Hyperlipidemia 05/04/2019  ? Migraines   ? Anxiety   ? Genetic testing 05/26/2017  ? Family history of uterine cancer   ? Family history of ovarian cancer   ? Family history of kidney cancer   ? Family history of prostate cancer   ? Uterine cancer ([place])   ? ? ?Past Medical History:  ?Diagnosis Date  ? Abnormal Pap smear of cervix   ? --age 21 had precancerous changes on pap and had hysterectomy  ? Alopecia   ? Anemia   ? Anxiety   ? Diverticulitis   ? Endometriosis   ? Family history of kidney cancer   ? Family history of ovarian cancer   ? Family history of prostate cancer   ? Family history of uterine cancer   ? High cholesterol   ? Migraines   ? Renal stones   ? Uterine cancer ([place])   ? dx at 28  ? ? ?Past Surgical History:  ?Procedure Laterality Date  ? ABDOMINAL HYSTERECTOMY    ? L-TAH--ovaries remain  ? left kidney blockage Left   ? age [age], and age 17  ? TONSILLECTOMY AND ADENOIDECTOMY    ? -age 40  ? urethral stent Left   ? age 18  ? ? ?Current Outpatient Medications  ?Medication Sig Dispense Refill  ? atorvastatin (LIPITOR) 40 MG tablet TAKE 1 TABLET(40 MG) BY MOUTH AT BEDTIME 90 tablet 1  ? Calcium Carb-Cholecalciferol (CALCIUM 1000 + D PO)     ? citalopram (CELEXA) 40 MG tablet TAKE 1 TABLET(40 MG) BY MOUTH AT BEDTIME 90 tablet 1  ? Cyanocobalamin  (VITAMIN B-12 PO) Take 1 tablet by mouth every other day.     ? eletriptan (RELPAX) 40 MG tablet Take 1 tablet (40 mg total) by mouth as needed for migraine or headache. May repeat in 2 hours if headache persists or recurs. 10 tablet 5  ? estradiol (ESTRACE) 0.1 MG/GM vaginal cream Use 1/2 g vaginally two or three times per week as needed to maintain symptom relief. 42.5 g 1  ? Hydrocortisone Acetate (VAGISIL) 1 % CREA Apply topically.    ? loratadine (CLARITIN) 10 MG tablet Take 10 mg by mouth daily.    ? MELATONIN PO Take by mouth.    ? Multiple Vitamin (MULTIVITAMIN) capsule Take 1 capsule by mouth daily.    ? nystatin (NYSTATIN) powder APPLY TOPICALLY TO THE AFFECTED AREA THREE TIMES DAILY FOR UP TO 7 DAYS 30 g 3  ? Polyethyl Glycol-Propyl  Glycol 0.4-0.3 % SOLN Place 1 drop into both eyes 2 (two) times daily.    ? Suvorexant (BELSOMRA) 10 MG TABS Take 10 mg by mouth at bedtime as needed. 30 tablet 2  ? ?No current facility-administered medications for this visit.  ?  ? ?ALLERGIES: Ivp dye [iodinated contrast media] ? ?Family History  ?Problem Relation Age of Onset  ? Diabetes Mother 47  ? Other Mother   ?     passed away from a blood clot after sinus surgery  ? Migraines Mother   ? COPD Father 50  ? Diabetes Father   ? Diabetes Sister   ? Cancer Maternal Grandfather 57  ?     dec metastatic prostate ca  ? Leukemia Paternal Aunt   ? Cancer Cousin   ?     uterine and ovarian  ? Cancer Cousin   ?     uterine and ovarian  ? Kidney cancer Paternal Uncle   ?     dx in his late 28s-30s  ? Glomerulonephritis Paternal Uncle   ? ? ?Social History  ? ?Socioeconomic History  ? Marital status: Married  ?  Spouse name: Not on file  ? Number of children: Not on file  ? Years of education: Not on file  ? Highest education level: Not on file  ?Occupational History  ? Not on file  ?Tobacco Use  ? Smoking status: Never  ? Smokeless tobacco: Never  ?Vaping Use  ? Vaping Use: Never used  ?Substance and Sexual Activity  ? Alcohol  use: Not Currently  ?  Alcohol/week: 0.0 standard drinks  ? Drug use: No  ? Sexual activity: Not Currently  ?  Partners: Male  ?  Comment: Hyst  ?Other Topics Concern  ? Not on file  ?Social History Narrative  ? Not on file  ? ?Social Determinants of Health  ? ?Financial Resource Strain: Not on file  ?Food Insecurity: Not on file  ?Transportation Needs: Not on file  ?Physical Activity: Not on file  ?Stress: Not on file  ?Social Connections: Not on file  ?Intimate Partner Violence: Not on file  ? ? ?Review of Systems ? ?PHYSICAL EXAMINATION:   ? ?There were no vitals taken for this visit.    ?General appearance: alert, cooperative and appears stated age ?Head: Normocephalic, without obvious abnormality, atraumatic ?Neck: no adenopathy, supple, symmetrical, trachea midline and thyroid normal to inspection and palpation ?Lungs: clear to auscultation bilaterally ?Breasts: normal appearance, no masses or tenderness, No nipple retraction or dimpling, No nipple discharge or bleeding, No axillary or supraclavicular adenopathy ?Heart: regular rate and rhythm ?Abdomen: soft, non-tender, no masses,  no organomegaly ?Extremities: extremities normal, atraumatic, no cyanosis or edema ?Skin: Skin color, texture, turgor normal. No rashes or lesions ?Lymph nodes: Cervical, supraclavicular, and axillary nodes normal. ?No abnormal inguinal nodes palpated ?Neurologic: Grossly normal ? ?Pelvic: External genitalia:  no lesions ?             Urethra:  normal appearing urethra with no masses, tenderness or lesions ?             Bartholins and Skenes: normal    ?             Vagina: normal appearing vagina with normal color and discharge, no lesions ?             Cervix: no lesions ?               ?Bimanual Exam:  Uterus:  normal size, contour, position, consistency, mobility, non-tender ?             Adnexa: no mass, fullness, tenderness ?             Rectal exam: {yes no:[id number]}.  Confirms. ?             Anus:  normal sphincter tone, no  lesions ? ?Chaperone was present for exam:  *** ? ?ASSESSMENT ? ?  ? ?PLAN ? ? ?  ?An After Visit Summary was printed and given to the patient. ? ?______ minutes face to face time of which over 50% was spent in counseling.  ? ?

## 2021-09-25 NOTE — Progress Notes (Signed)
GYNECOLOGY  VISIT ?  ?HPI: ?61 y.o.   Married  Caucasian  female   ?G1P1001 with No LMP recorded. Patient has had a hysterectomy.   ?here for pessary check.  ? ?Patient fitted and given an incontinence dish, 2 3/4 inches, 70 mm, on 09/08/21.  ?Her prior ring with support was too small, and she was having urinary incontinence with it.  ? ?Takes long to void.  ?Feels like she is voiding differently.  ?No urinary leakage.  ? ?Bulge is now up further in the vagina.  ? ?Bowel function is perhaps better with this pessary. ?BM flows better per patient.  ? ?No pain, discomfort, vaginal discharge, or vaginal bleeding.  ? ?Using vaginal cream 2 times a week. ? ?Able to place and remove. ? ? ?GYNECOLOGIC HISTORY: ?No LMP recorded. Patient has had a hysterectomy. ?Contraception:  Hyst ?Menopausal hormone therapy:  Estrace cream ?Last mammogram:   10-28-20 Neg/Birads1--Appt. 10-29-21 ?Last pap smear:   07-12-19 Neg:Neg HR HPV, 05-13-18 Neg:Neg HR HPV, 03-19-17 Neg:Neg HR HPV ?       ?OB History   ? ? Gravida  ?1  ? Para  ?1  ? Term  ?1  ? Preterm  ?   ? AB  ?   ? Living  ?1  ?  ? ? SAB  ?   ? IAB  ?   ? Ectopic  ?   ? Multiple  ?   ? Live Births  ?   ?   ?  ?  ?    ? ?Patient Active Problem List  ? Diagnosis Date Noted  ? Hyperlipidemia 05/04/2019  ? Migraines   ? Anxiety   ? Genetic testing 05/26/2017  ? Family history of uterine cancer   ? Family history of ovarian cancer   ? Family history of kidney cancer   ? Family history of prostate cancer   ? Uterine cancer ([place])   ? ? ?Past Medical History:  ?Diagnosis Date  ? Abnormal Pap smear of cervix   ? --age 45 had precancerous changes on pap and had hysterectomy  ? Alopecia   ? Anemia   ? Anxiety   ? Diverticulitis   ? Endometriosis   ? Family history of kidney cancer   ? Family history of ovarian cancer   ? Family history of prostate cancer   ? Family history of uterine cancer   ? High cholesterol   ? Migraines   ? Renal stones   ? Uterine cancer ([place])   ? dx at 12  ? ? ?Past  Surgical History:  ?Procedure Laterality Date  ? ABDOMINAL HYSTERECTOMY    ? L-TAH--ovaries remain  ? left kidney blockage Left   ? age [age], and age 27  ? TONSILLECTOMY AND ADENOIDECTOMY    ? -age 7  ? urethral stent Left   ? age 76  ? ? ?Current Outpatient Medications  ?Medication Sig Dispense Refill  ? atorvastatin (LIPITOR) 40 MG tablet TAKE 1 TABLET(40 MG) BY MOUTH AT BEDTIME 90 tablet 1  ? Calcium Carb-Cholecalciferol (CALCIUM 1000 + D PO)     ? citalopram (CELEXA) 40 MG tablet TAKE 1 TABLET(40 MG) BY MOUTH AT BEDTIME 90 tablet 1  ? Cyanocobalamin (VITAMIN B-12 PO) Take 1 tablet by mouth every other day.     ? diphenhydrAMINE (BENADRYL) 25 MG tablet Take 25 mg by mouth at bedtime as needed.    ? eletriptan (RELPAX) 40 MG tablet Take 1 tablet (40 mg  total) by mouth as needed for migraine or headache. May repeat in 2 hours if headache persists or recurs. 10 tablet 5  ? estradiol (ESTRACE) 0.1 MG/GM vaginal cream Use 1/2 g vaginally two or three times per week as needed to maintain symptom relief. 42.5 g 1  ? Hydrocortisone Acetate 1 % CREA Apply topically.    ? loratadine (CLARITIN) 10 MG tablet Take 10 mg by mouth daily.    ? MELATONIN PO Take by mouth.    ? Multiple Vitamin (MULTIVITAMIN) capsule Take 1 capsule by mouth daily.    ? nystatin (NYSTATIN) powder APPLY TOPICALLY TO THE AFFECTED AREA THREE TIMES DAILY FOR UP TO 7 DAYS 30 g 3  ? Polyethyl Glycol-Propyl Glycol 0.4-0.3 % SOLN Place 1 drop into both eyes 2 (two) times daily.    ? Suvorexant (BELSOMRA) 10 MG TABS Take 10 mg by mouth at bedtime as needed. 30 tablet 2  ? ?No current facility-administered medications for this visit.  ?  ? ?ALLERGIES: Ivp dye [iodinated contrast media] ? ?Family History  ?Problem Relation Age of Onset  ? Diabetes Mother 13  ? Other Mother   ?     passed away from a blood clot after sinus surgery  ? Migraines Mother   ? COPD Father 58  ? Diabetes Father   ? Diabetes Sister   ? Cancer Maternal Grandfather 21  ?     dec  metastatic prostate ca  ? Leukemia Paternal Aunt   ? Cancer Cousin   ?     uterine and ovarian  ? Cancer Cousin   ?     uterine and ovarian  ? Kidney cancer Paternal Uncle   ?     dx in his late 50s-30s  ? Glomerulonephritis Paternal Uncle   ? ? ?Social History  ? ?Socioeconomic History  ? Marital status: Married  ?  Spouse name: Not on file  ? Number of children: Not on file  ? Years of education: Not on file  ? Highest education level: Not on file  ?Occupational History  ? Not on file  ?Tobacco Use  ? Smoking status: Never  ? Smokeless tobacco: Never  ?Vaping Use  ? Vaping Use: Never used  ?Substance and Sexual Activity  ? Alcohol use: Not Currently  ?  Alcohol/week: 0.0 standard drinks  ? Drug use: No  ? Sexual activity: Not Currently  ?  Partners: Male  ?  Comment: Hyst  ?Other Topics Concern  ? Not on file  ?Social History Narrative  ? Not on file  ? ?Social Determinants of Health  ? ?Financial Resource Strain: Not on file  ?Food Insecurity: Not on file  ?Transportation Needs: Not on file  ?Physical Activity: Not on file  ?Stress: Not on file  ?Social Connections: Not on file  ?Intimate Partner Violence: Not on file  ? ? ?Review of Systems  ?All other systems reviewed and are negative. ? ?PHYSICAL EXAMINATION:   ? ?BP 122/84   Pulse 95   Ht 5\' 2"  (1.575 m)   Wt 168 lb (76.2 kg)   SpO2 95%   BMI 30.73 kg/m?     ?General appearance: alert, cooperative and appears stated age ?  ?Pelvic: External genitalia:  no lesions ?             Urethra:  normal appearing urethra with no masses, tenderness or lesions ?             Bartholins and Skenes: normal    ?  Vagina: inflammation of the right vaginal cuff.  Bleeds with touching with a Q-Tip.  ?             Cervix: absent ?               ?Bimanual Exam:  Uterus:  absent ?             Adnexa: no mass, fullness, tenderness ?     ? ?Chaperone was present for exam:  Estill Bamberg, CMA ? ?ASSESSMENT ? ?Pessary maintenance.  ?Vaginal inflammation with pessary use.   ?Rectocele.  ?Urinary incontinence.  ? ?PLAN ? ?Continue incontinence dish pessary. ?Use vaginal estrogen cream three times per week.  ?Leave your pessary out overnight at least one time per week. ?When this is done, place the estrogen cream in the vagina that night.  ?She has one more refill of her vaginal estrogen cream, and she will refill this before the Rx expires.  ?Fu for annual exam and recheck in April.  ?Mammogram scheduled for the week prior to that.  ?  ?An After Visit Summary was printed and given to the patient. ? ?  ? ? ?

## 2021-09-26 ENCOUNTER — Other Ambulatory Visit: Payer: Self-pay | Admitting: Obstetrics and Gynecology

## 2021-09-26 ENCOUNTER — Ambulatory Visit: Payer: BC Managed Care – PPO | Admitting: Obstetrics and Gynecology

## 2021-09-26 DIAGNOSIS — Z1231 Encounter for screening mammogram for malignant neoplasm of breast: Secondary | ICD-10-CM

## 2021-09-29 ENCOUNTER — Other Ambulatory Visit: Payer: Self-pay

## 2021-09-29 ENCOUNTER — Encounter: Payer: Self-pay | Admitting: Obstetrics and Gynecology

## 2021-09-29 ENCOUNTER — Ambulatory Visit: Payer: BC Managed Care – PPO | Admitting: Obstetrics and Gynecology

## 2021-09-29 VITALS — BP 122/84 | HR 95 | Ht 62.0 in | Wt 168.0 lb

## 2021-09-29 DIAGNOSIS — T8369XA Infection and inflammatory reaction due to other prosthetic device, implant and graft in genital tract, initial encounter: Secondary | ICD-10-CM

## 2021-09-29 DIAGNOSIS — N760 Acute vaginitis: Secondary | ICD-10-CM

## 2021-09-29 DIAGNOSIS — Z4689 Encounter for fitting and adjustment of other specified devices: Secondary | ICD-10-CM | POA: Diagnosis not present

## 2021-09-29 NOTE — Patient Instructions (Signed)
Please use your vaginal estrogen cream three times per week.  ? ?Leave your pessary out overnight at least one time per week. ?When you do this, please place the estrogen cream in the vagina that night.  ?

## 2021-10-29 ENCOUNTER — Ambulatory Visit
Admission: RE | Admit: 2021-10-29 | Discharge: 2021-10-29 | Disposition: A | Discharge status: Home / Self Care | Payer: BC Managed Care – PPO | Source: Ambulatory Visit | Attending: Obstetrics and Gynecology | Admitting: Obstetrics and Gynecology

## 2021-10-29 DIAGNOSIS — Z1231 Encounter for screening mammogram for malignant neoplasm of breast: Secondary | ICD-10-CM | POA: Diagnosis not present

## 2021-10-30 ENCOUNTER — Other Ambulatory Visit: Payer: Self-pay | Admitting: Obstetrics and Gynecology

## 2021-11-04 NOTE — Progress Notes (Signed)
61 y.o. G53P1001 Married Caucasian female here for annual exam.   ? ?Pessary doing well. ?Leaves out weekly for 48 hours or so.  ? ?Using vaginal estrogen cream twice a week.  ? ?Wants refill of estrogen cream and Nystatin powder.  ? ?PCP:  Micheline Rough, MD ? ?No LMP recorded. Patient has had a hysterectomy.     ?  ?    ?Sexually active: No.  ?The current method of family planning is status post hysterectomy.    ?Exercising: No.  The patient does not participate in regular exercise at present. ?Smoker:  no ? ?Health Maintenance: ?Pap:   07-12-19 Neg:Neg HR HPV, 05-13-18 Neg:Neg HR HPV, 03-19-17 Neg:Neg HR HPV ?History of abnormal Pap:  Yes, at 61 y.o. had pre-cancerous cells on pap which lead to hysterectomy. ?MMG:  10-29-21 Neg/BiRads1 ?Colonoscopy:   01-09-14 normal except for diverticulitis;next 12/2023. ?BMD:  2010  Result :Normal in [facility] ?TDaP:  02-01-15 ?Gardasil:   n/a ?HIV: PCP ?Hep C: 04-14-19 NR ?Screening Labs:  PCP ? ? reports that she has never smoked. She has never used smokeless tobacco. She reports that she does not currently use alcohol. She reports that she does not use drugs. ? ?Past Medical History:  ?Diagnosis Date  ? Abnormal Pap smear of cervix   ? --age 84 had precancerous changes on pap and had hysterectomy  ? Alopecia   ? Anemia   ? Anxiety   ? Diverticulitis   ? Endometriosis   ? Family history of kidney cancer   ? Family history of ovarian cancer   ? Family history of prostate cancer   ? Family history of uterine cancer   ? High cholesterol   ? Migraines   ? Renal stones   ? Uterine cancer ([place])   ? dx at 11  ? ? ?Past Surgical History:  ?Procedure Laterality Date  ? ABDOMINAL HYSTERECTOMY    ? L-TAH--ovaries remain  ? left kidney blockage Left   ? age [age], and age 51  ? TONSILLECTOMY AND ADENOIDECTOMY    ? -age 59  ? urethral stent Left   ? age 46  ? ? ?Current Outpatient Medications  ?Medication Sig Dispense Refill  ? atorvastatin (LIPITOR) 40 MG tablet TAKE 1 TABLET(40 MG) BY MOUTH  AT BEDTIME 90 tablet 1  ? Calcium Carb-Cholecalciferol (CALCIUM 1000 + D PO)     ? citalopram (CELEXA) 40 MG tablet TAKE 1 TABLET(40 MG) BY MOUTH AT BEDTIME 90 tablet 1  ? Cyanocobalamin (VITAMIN B-12 PO) Take 1 tablet by mouth every other day.     ? diphenhydrAMINE (BENADRYL) 25 MG tablet Take 25 mg by mouth at bedtime as needed.    ? eletriptan (RELPAX) 40 MG tablet Take 1 tablet (40 mg total) by mouth as needed for migraine or headache. May repeat in 2 hours if headache persists or recurs. 10 tablet 5  ? estradiol (ESTRACE) 0.1 MG/GM vaginal cream Use 1/2 g vaginally two or three times per week as needed to maintain symptom relief. 42.5 g 1  ? Hydrocortisone Acetate 1 % CREA Apply topically.    ? loratadine (CLARITIN) 10 MG tablet Take 10 mg by mouth daily.    ? MELATONIN PO Take by mouth.    ? Multiple Vitamin (MULTIVITAMIN) capsule Take 1 capsule by mouth daily.    ? nystatin (NYSTATIN) powder APPLY TOPICALLY TO THE AFFECTED AREA THREE TIMES DAILY FOR UP TO 7 DAYS 30 g 3  ? Polyethyl Glycol-Propyl Glycol 0.4-0.3 %  SOLN Place 1 drop into both eyes 2 (two) times daily.    ? Suvorexant (BELSOMRA) 10 MG TABS Take 10 mg by mouth at bedtime as needed. 30 tablet 2  ? ?No current facility-administered medications for this visit.  ? ? ?Family History  ?Problem Relation Age of Onset  ? Diabetes Mother 32  ? Other Mother   ?     passed away from a blood clot after sinus surgery  ? Migraines Mother   ? COPD Father 62  ? Diabetes Father   ? Diabetes Sister   ? Cancer Maternal Grandfather 68  ?     dec metastatic prostate ca  ? Leukemia Paternal Aunt   ? Cancer Cousin   ?     uterine and ovarian  ? Cancer Cousin   ?     uterine and ovarian  ? Kidney cancer Paternal Uncle   ?     dx in his late 42s-30s  ? Glomerulonephritis Paternal Uncle   ? ? ?Review of Systems  ?All other systems reviewed and are negative. ? ?Exam:   ?BP 140/80   Pulse 81   Ht '5\' 2"'$  (1.575 m)   Wt 178 lb (80.7 kg)   SpO2 97%   BMI 32.56 kg/m?      ?General appearance: alert, cooperative and appears stated age ?Head: normocephalic, without obvious abnormality, atraumatic ?Neck: no adenopathy, supple, symmetrical, trachea midline and thyroid normal to inspection and palpation ?Lungs: clear to auscultation bilaterally ?Breasts: normal appearance, no masses or tenderness, No nipple retraction or dimpling, No nipple discharge or bleeding, No axillary adenopathy ?Heart: regular rate and rhythm ?Abdomen: soft, non-tender; no masses, no organomegaly ?Extremities: extremities normal, atraumatic, no cyanosis or edema ?Skin: skin color, texture, turgor normal. No rashes or lesions ?Lymph nodes: cervical, supraclavicular, and axillary nodes normal. ?Neurologic: grossly normal ? ?Pelvic: External genitalia:  no lesions ?             No abnormal inguinal nodes palpated. ?             Urethra:  normal appearing urethra with no masses, tenderness or lesions ?             Bartholins and Skenes: normal    ?             Vagina: normal appearing vagina with normal color and discharge, no lesions ?             Cervix: absent ?             Pap taken: no ?Bimanual Exam:  Uterus:  absent ?             Adnexa: no mass, fullness, tenderness ?             Rectal exam: yes.  Confirms. ?             Anus:  normal sphincter tone, no lesions ? ?Pessary incontinence dish removed, cleansed, and replaced.  ? ?Chaperone was present for exam:  Kimalexis, CMA ? ?Assessment:   ?Well woman visit with gynecologic exam. ?Status post hysterectomy for cervical dysplasia age 89.  Ovaries remain.  ?Hx GSI.  ?Rectocele. ?Alopecia. ?Paternal family with cancer history.  Negative genetic testing.  Had POLE VUS.  ?Candida of flexural skin. ? ?Plan: ?Mammogram screening discussed. ?Self breast awareness reviewed. ?Pap and HR HPV 2024.  ?Guidelines for Calcium, Vitamin D, regular exercise program including cardiovascular and weight bearing exercise. ?Continue current pessary regimen.  ?  Rx for vaginal Estrace  cream.  I discussed potential effect on breast cancer.  ?Rx for Nystatin powder.  ?Follow up annually and prn.  ? ?After visit summary provided.  ? ? ? ?

## 2021-11-05 ENCOUNTER — Ambulatory Visit (INDEPENDENT_AMBULATORY_CARE_PROVIDER_SITE_OTHER): Payer: BC Managed Care – PPO | Admitting: Obstetrics and Gynecology

## 2021-11-05 ENCOUNTER — Encounter: Payer: Self-pay | Admitting: Obstetrics and Gynecology

## 2021-11-05 VITALS — BP 140/80 | HR 81 | Ht 62.0 in | Wt 178.0 lb

## 2021-11-05 DIAGNOSIS — Z01419 Encounter for gynecological examination (general) (routine) without abnormal findings: Secondary | ICD-10-CM | POA: Diagnosis not present

## 2021-11-05 MED ORDER — NYSTATIN 100000 UNIT/GM EX POWD: CUTANEOUS | 3 refills | Status: AC

## 2021-11-05 MED ORDER — ESTRADIOL 0.1 MG/GM VA CREA: TOPICAL_CREAM | VAGINAL | 1 refills | Status: DC

## 2021-11-05 NOTE — Patient Instructions (Signed)

## 2021-11-08 ENCOUNTER — Other Ambulatory Visit: Payer: Self-pay | Admitting: Family Medicine

## 2021-11-08 DIAGNOSIS — F5101 Primary insomnia: Secondary | ICD-10-CM

## 2021-11-29 DIAGNOSIS — H938X1 Other specified disorders of right ear: Secondary | ICD-10-CM | POA: Diagnosis not present

## 2022-02-20 ENCOUNTER — Other Ambulatory Visit: Payer: Self-pay | Admitting: *Deleted

## 2022-02-23 MED ORDER — CITALOPRAM HYDROBROMIDE 40 MG PO TABS: ORAL_TABLET | ORAL | 0 refills | Status: DC

## 2022-03-14 ENCOUNTER — Encounter: Payer: Self-pay | Admitting: Obstetrics and Gynecology

## 2022-03-16 ENCOUNTER — Encounter: Payer: Self-pay | Admitting: Family Medicine

## 2022-03-16 MED ORDER — ESTRADIOL 0.1 MG/GM VA CREA: TOPICAL_CREAM | VAGINAL | 1 refills | Status: DC

## 2022-03-16 NOTE — Telephone Encounter (Signed)
AEX 11/07/21 MMG neg 10/29/2021

## 2022-03-17 ENCOUNTER — Other Ambulatory Visit: Payer: Self-pay | Admitting: *Deleted

## 2022-03-17 MED ORDER — ATORVASTATIN CALCIUM 40 MG PO TABS: ORAL_TABLET | ORAL | 1 refills | Status: DC

## 2022-04-17 ENCOUNTER — Encounter: Payer: BC Managed Care – PPO | Admitting: Family Medicine

## 2022-04-29 ENCOUNTER — Other Ambulatory Visit: Payer: Self-pay | Admitting: *Deleted

## 2022-04-29 MED ORDER — ELETRIPTAN HYDROBROMIDE 40 MG PO TABS: 40.0000 mg | ORAL_TABLET | ORAL | 0 refills | Status: DC | PRN

## 2022-05-15 ENCOUNTER — Other Ambulatory Visit: Payer: Self-pay | Admitting: *Deleted

## 2022-05-15 DIAGNOSIS — F5101 Primary insomnia: Secondary | ICD-10-CM

## 2022-05-15 MED ORDER — BELSOMRA 10 MG PO TABS: 1.0000 | ORAL_TABLET | Freq: Every evening | ORAL | 0 refills | Status: DC | PRN

## 2022-05-15 NOTE — Telephone Encounter (Signed)
Technically this is a schedule IV controlled substance and I have never seen her in the office-- I will only give her 7 days to get her into her appt

## 2022-05-22 ENCOUNTER — Ambulatory Visit: Payer: BC Managed Care – PPO | Admitting: Family Medicine

## 2022-05-22 ENCOUNTER — Encounter: Payer: Self-pay | Admitting: Family Medicine

## 2022-05-22 VITALS — BP 120/84 | HR 75 | Temp 98.3°F | Ht 62.0 in | Wt 179.2 lb

## 2022-05-22 DIAGNOSIS — G43009 Migraine without aura, not intractable, without status migrainosus: Secondary | ICD-10-CM | POA: Diagnosis not present

## 2022-05-22 DIAGNOSIS — R7303 Prediabetes: Secondary | ICD-10-CM | POA: Diagnosis not present

## 2022-05-22 DIAGNOSIS — F419 Anxiety disorder, unspecified: Secondary | ICD-10-CM

## 2022-05-22 DIAGNOSIS — E7800 Pure hypercholesterolemia, unspecified: Secondary | ICD-10-CM | POA: Diagnosis not present

## 2022-05-22 DIAGNOSIS — F5101 Primary insomnia: Secondary | ICD-10-CM

## 2022-05-22 MED ORDER — ATORVASTATIN CALCIUM 40 MG PO TABS: ORAL_TABLET | ORAL | 3 refills | Status: DC

## 2022-05-22 MED ORDER — BUSPIRONE HCL 5 MG PO TABS: 5.0000 mg | ORAL_TABLET | Freq: Two times a day (BID) | ORAL | 1 refills | Status: DC

## 2022-05-22 MED ORDER — CITALOPRAM HYDROBROMIDE 40 MG PO TABS: ORAL_TABLET | ORAL | 3 refills | Status: DC

## 2022-05-22 NOTE — Patient Instructions (Signed)
It was very nice to see you today!  Try 1/2 tab twice/day of citalopram.  Will start buspar '5mg'$  in the am  may increase dose if needed up to '15mg'$ .    PLEASE NOTE:  If you had any lab tests please let us know if you have not heard back within a few days. You may see your results on MyChart before we have a chance to review them but we will give you a call once they are reviewed by [place]. If we ordered any referrals today, please let us know if you have not heard from their office within the next week.   Please try these tips to maintain a healthy lifestyle:  Eat most of your calories during the day when you are active. Eliminate processed foods including packaged sweets (pies, cakes, cookies), reduce intake of potatoes, white bread, white pasta, and white rice. Look for whole grain options, oat flour or almond flour.  Each meal should contain half fruits/vegetables, one quarter protein, and one quarter carbs (no bigger than a computer mouse).  Cut down on sweet beverages. This includes juice, soda, and sweet tea. Also watch fruit intake, though this is a healthier sweet option, it still contains natural sugar! Limit to 3 servings daily.  Drink at least 1 glass of water with each meal and aim for at least 8 glasses per day  Exercise at least 150 minutes every week.

## 2022-05-22 NOTE — Progress Notes (Signed)
Subjective:     Patient ID: Maria Gross, female    DOB: 1960/11/20, 61 y.o.   MRN: [id number]  Chief Complaint  Patient presents with   Transfer of Care    Transfer of Care due to provider leaving the practice     HPI TOC-doctor left PreDM-no exercise.  Working on diet.  HLD-taking atorvastatin.  Working on diet. No adverse effects  Migraine-gets about once/month-this month was worst one.  Replax works well.  Occ takes ibuprofen/tylnol.    Anxiety/depression-citalopram '40mg'$ -mostly helps, but at work, can get upset/angry/irrit easily. No SI.   Insomnia-taking belsomra-[facility] said not in stock, then told rx rejected.  Taking benadryl as ran out.  Helps sleep and allergies,but belsomra works better as far as getting to sleep sooner.  .   Health Maintenance Due  Topic Date Due   HIV Screening  Never done   PAP SMEAR-Modifier  07/11/2022    Past Medical History:  Diagnosis Date   Abnormal Pap smear of cervix    --age 89 had precancerous changes on pap and had hysterectomy   Alopecia    Anemia    Anxiety    Diverticulitis    Endometriosis    Family history of kidney cancer    Family history of ovarian cancer    Family history of prostate cancer    Family history of uterine cancer    High cholesterol    Migraines    some w/aura   Renal stones    Uterine cancer ([place])    dx at 52    Past Surgical History:  Procedure Laterality Date   ABDOMINAL HYSTERECTOMY     L-TAH--ovaries remain--uterine   left kidney blockage Left    age [age], and age 61   TONSILLECTOMY AND ADENOIDECTOMY     -age 13   urethral stent Left    age 53    Outpatient Medications Prior to Visit  Medication Sig Dispense Refill   Calcium Carb-Cholecalciferol (CALCIUM 1000 + D PO)      Cyanocobalamin (VITAMIN B-12 PO) Take 1 tablet by mouth every other day.      diphenhydrAMINE (BENADRYL) 25 MG tablet Take 25 mg by mouth at bedtime as needed.     eletriptan (RELPAX) 40 MG tablet Take 1 tablet  (40 mg total) by mouth as needed for migraine or headache. May repeat in 2 hours if headache persists or recurs. 10 tablet 0   estradiol (ESTRACE) 0.1 MG/GM vaginal cream Use 1/2 g vaginally two or three times per week as needed to maintain symptom relief. 42.5 g 1   Hydrocortisone Acetate 1 % CREA Apply topically.     loratadine (CLARITIN) 10 MG tablet Take 10 mg by mouth daily.     MELATONIN PO Take 10 mg by mouth at bedtime.     Multiple Vitamin (MULTIVITAMIN) capsule Take 1 capsule by mouth daily.     nystatin powder APPLY TOPICALLY TO THE AFFECTED AREA THREE TIMES DAILY FOR UP TO 7 DAYS 30 g 3   Polyethyl Glycol-Propyl Glycol 0.4-0.3 % SOLN Place 1 drop into both eyes 2 (two) times daily.     atorvastatin (LIPITOR) 40 MG tablet TAKE 1 TABLET(40 MG) BY MOUTH AT BEDTIME 90 tablet 1   citalopram (CELEXA) 40 MG tablet TAKE 1 TABLET(40 MG) BY MOUTH AT BEDTIME 90 tablet 0   Suvorexant (BELSOMRA) 10 MG TABS Take 1 tablet by mouth at bedtime as needed. (Patient not taking: Reported on 05/22/2022) 7 tablet 0  No facility-administered medications prior to visit.    Allergies  Allergen Reactions   Ivp Dye [Iodinated Contrast Media] Other (See Comments)    seizures   ROS neg/noncontributory except as noted HPI/below      Objective:     BP 120/84   Pulse 75   Temp 98.3 F (36.8 C) (Temporal)   Ht '5\' 2"'$  (1.575 m)   Wt 179 lb 4 oz (81.3 kg)   SpO2 97%   BMI 32.79 kg/m  Wt Readings from Last 3 Encounters:  05/22/22 179 lb 4 oz (81.3 kg)  11/05/21 178 lb (80.7 kg)  09/29/21 168 lb (76.2 kg)    Physical Exam   Gen: WDWN NAD [facility] HEENT: NCAT, conjunctiva not injected, sclera nonicteric NECK:  supple, no thyromegaly, no nodes, no carotid bruits CARDIAC: RRR, S1S2+, no murmur. DP 2+B LUNGS: CTAB. No wheezes ABDOMEN:  BS+, soft, NTND, No HSM, no masses EXT:  no edema MSK: no gross abnormalities.  NEURO: A&O x3.  CN II-XII intact.  PSYCH: normal mood. Good eye contact      Assessment & Plan:   Problem List Items Addressed This Visit       Cardiovascular and Mediastinum   Migraines   Relevant Medications   citalopram (CELEXA) 40 MG tablet   atorvastatin (LIPITOR) 40 MG tablet     Other   Hyperlipidemia   Relevant Medications   atorvastatin (LIPITOR) 40 MG tablet   Anxiety   Relevant Medications   citalopram (CELEXA) 40 MG tablet   busPIRone (BUSPAR) 5 MG tablet   Prediabetes - Primary   Other Visit Diagnoses     Primary insomnia         1.  Prediabetes-chronic.  Controlled.  Continue diet.  Encouraged exercise even if broken into small increments throughout the day. 2.  Hyperlipidemia-chronic.  Controlled.  Continue atorvastatin 40 mg daily (renewed) follow-up around February for annual physical and labs 3.  Migraine-without aura.  Chronic.  Well-controlled.  Continue Relpax 40 mg daily as needed at onset of headache. 4.  Anxiety/depression-chronic.  Not well controlled.  The stress of the day at work can set her off easily.  Continue citalopram 40 mg daily.  Add BuSpar 5 mg in the morning.  She can use it twice daily.  Also can increase up to 15 mg if need be. 5.  Insomnia-chronic.  Had been well controlled on Belsomra.  She ran out and had difficulty getting it, so she was using Benadryl.  This does help and also helps her allergies.  No adverse effects.  For now, continue Benadryl at at bedtime.  If it is getting worse she has adverse effects or not working, call us and we will refill the [place].  Due for annual physical and labs after January.  We will schedule  Meds ordered this encounter  Medications   citalopram (CELEXA) 40 MG tablet    Sig: TAKE 1 TABLET(40 MG) BY MOUTH AT BEDTIME    Dispense:  90 tablet    Refill:  3   atorvastatin (LIPITOR) 40 MG tablet    Sig: TAKE 1 TABLET(40 MG) BY MOUTH AT BEDTIME    Dispense:  90 tablet    Refill:  3   busPIRone (BUSPAR) 5 MG tablet    Sig: Take 1 tablet (5 mg total) by mouth 2 (two) times  daily.    Dispense:  180 tablet    Refill:  1    Wellington Hampshire, MD

## 2022-05-25 ENCOUNTER — Other Ambulatory Visit: Payer: Self-pay | Admitting: Family Medicine

## 2022-06-06 ENCOUNTER — Encounter: Payer: Self-pay | Admitting: Obstetrics and Gynecology

## 2022-07-09 IMAGING — MG MM DIGITAL SCREENING BILAT W/ TOMO AND CAD
8 series · 8 of 24 positions shown · non-contrast
Comparison: Previous exam(s).

CLINICAL DATA: Screening.

EXAM:
DIGITAL SCREENING BILATERAL MAMMOGRAM WITH TOMOSYNTHESIS AND CAD
TECHNIQUE: Bilateral screening digital craniocaudal and mediolateral oblique
mammograms were obtained. Bilateral screening digital breast
tomosynthesis was performed. The images were evaluated with
computer-aided detection.

[L MLO synth-2D]
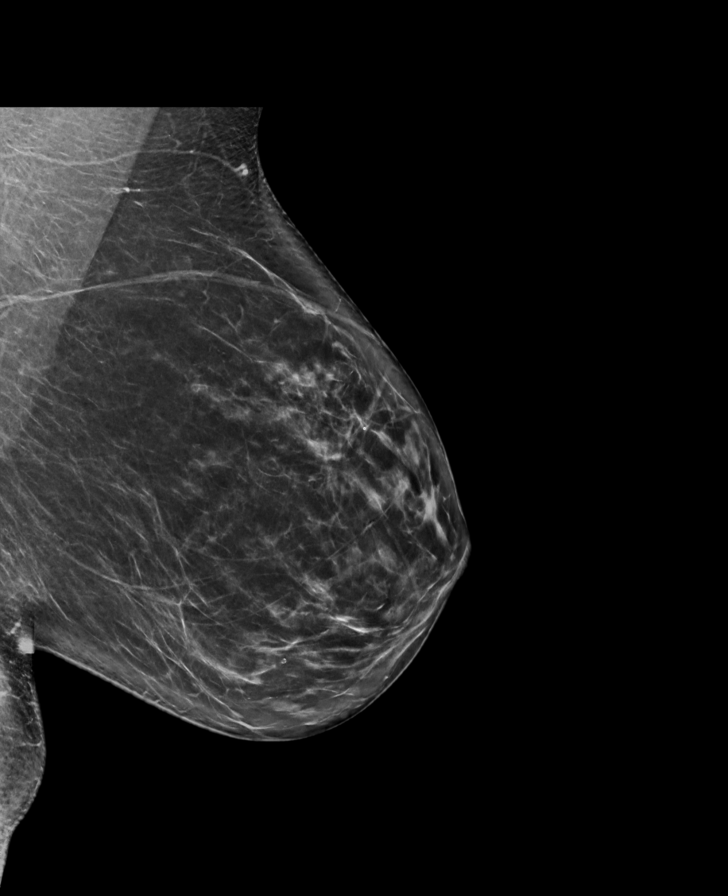

[L CC synth-2D]
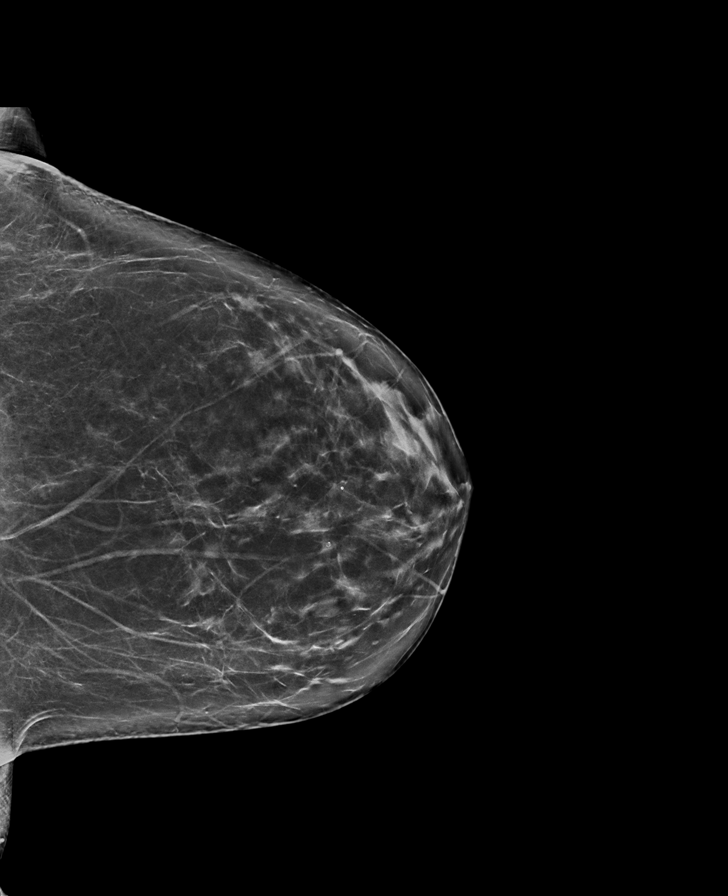

[R CC synth-2D]
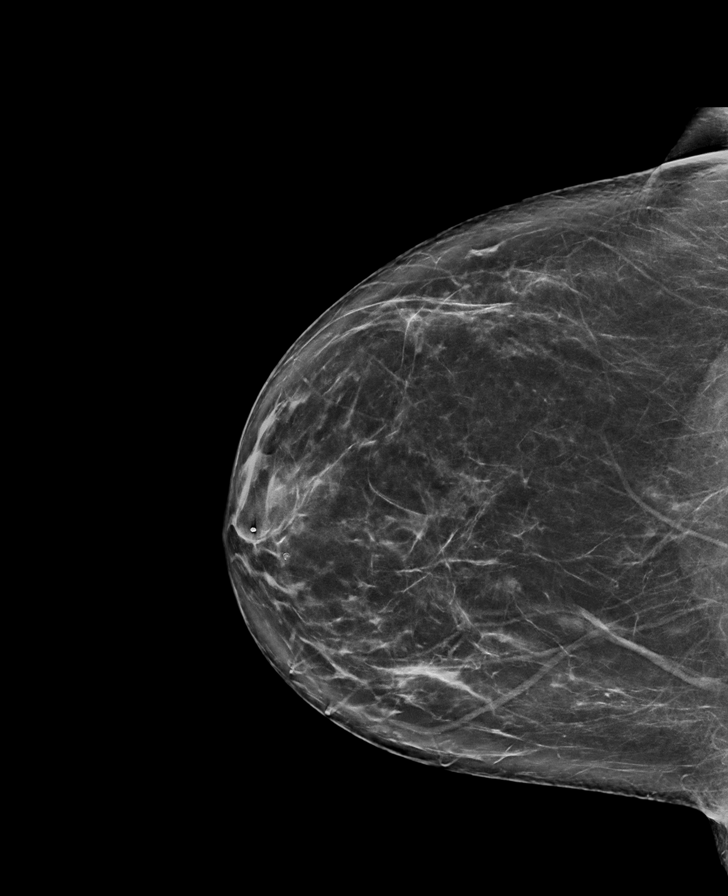

[R MLO synth-2D]
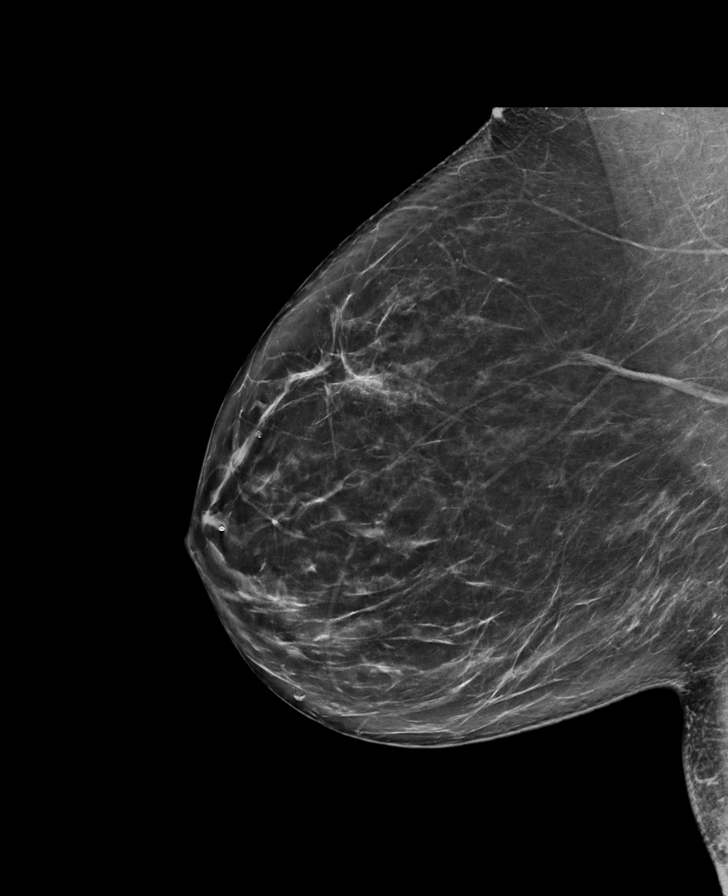

[L CC tomo · tomo slice 39/78.0]
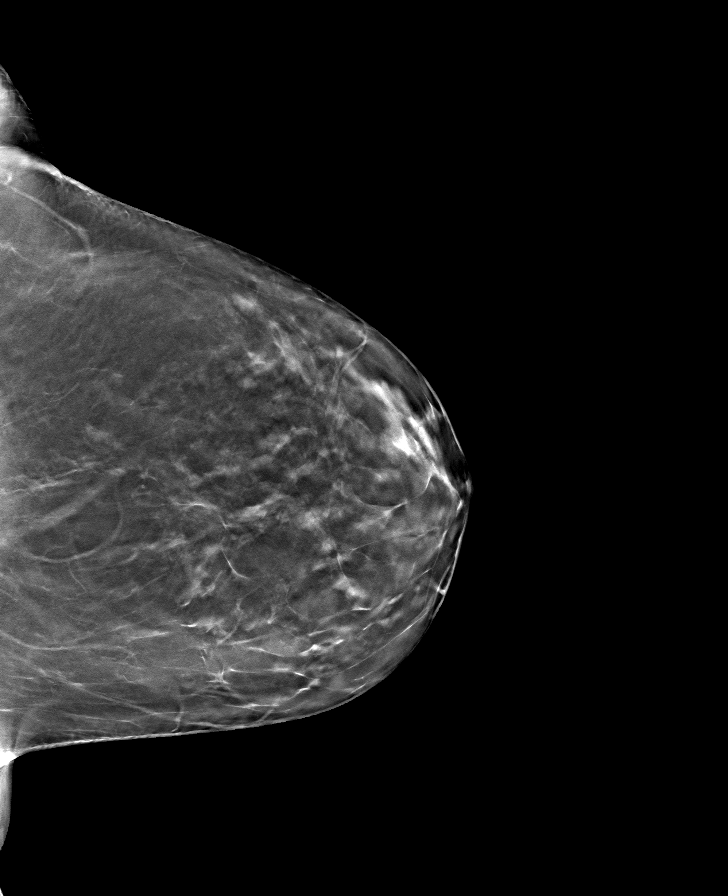

[L MLO tomo · tomo slice 41/80.0]
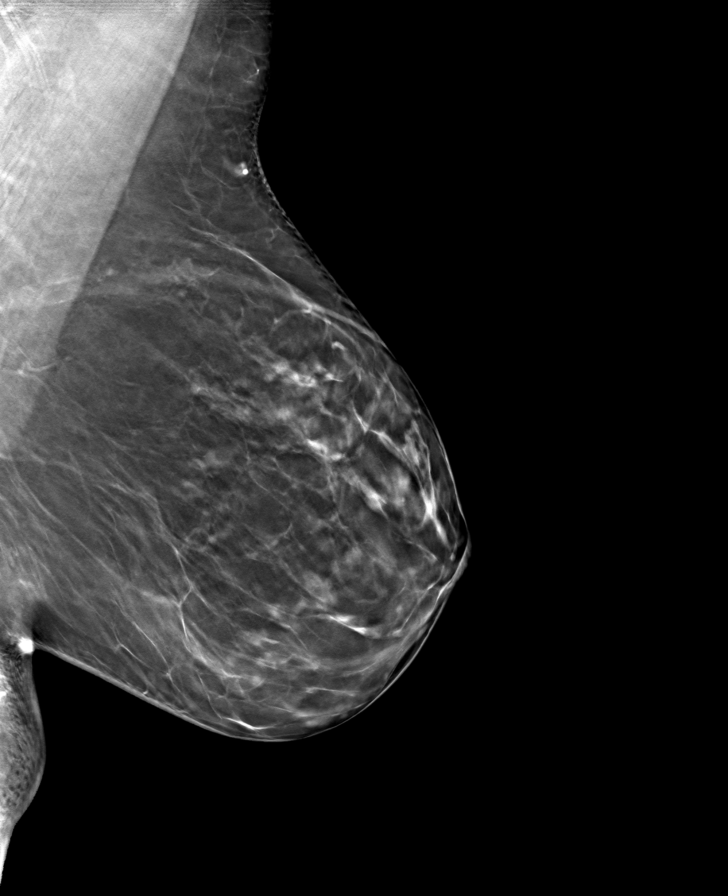

[R MLO tomo · tomo slice 40/79.0]
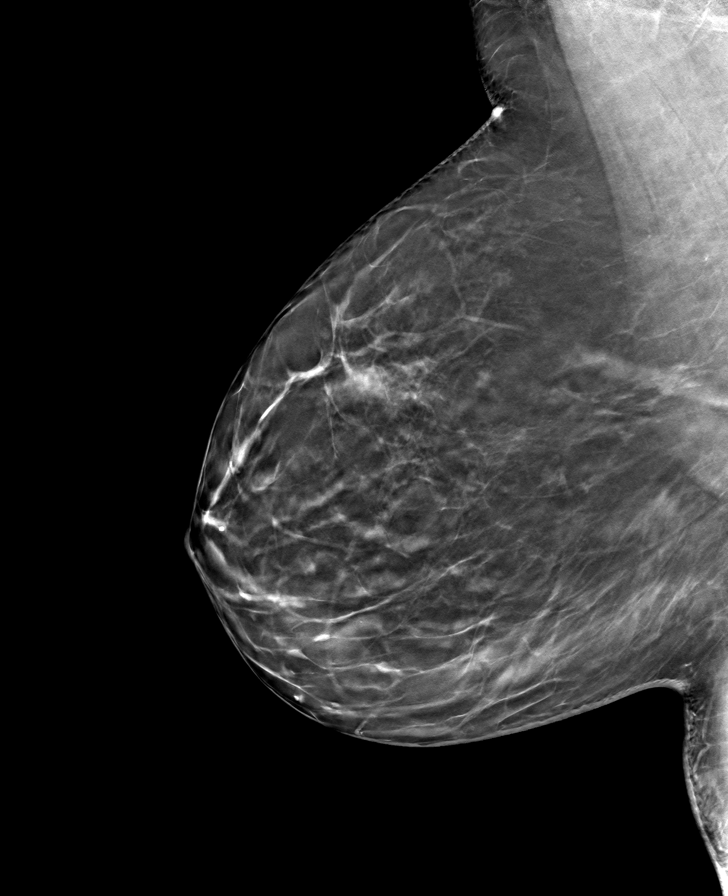

[R CC tomo · tomo slice 41/82.0]
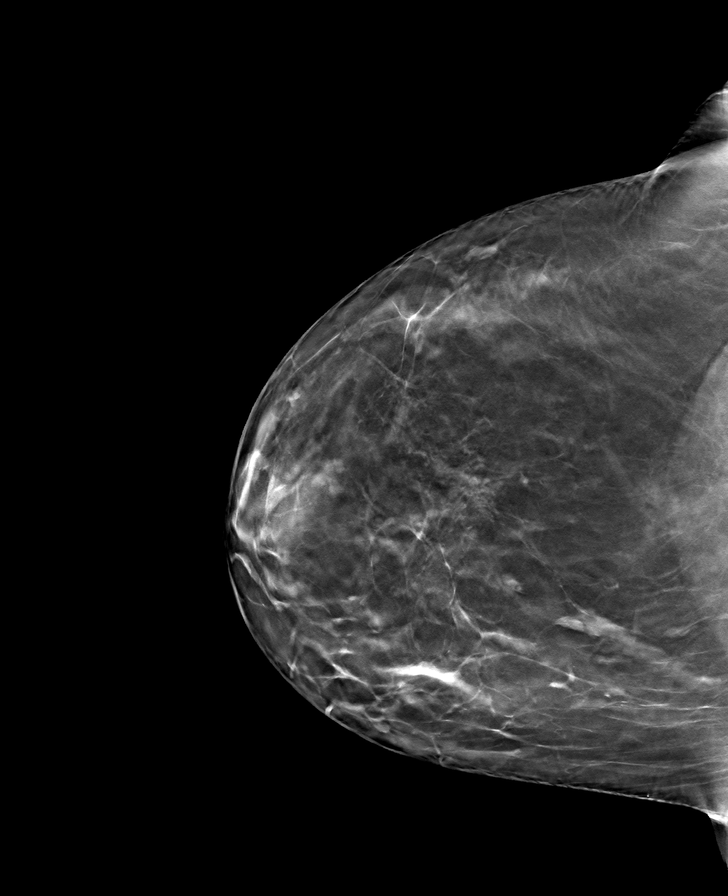

[8 of 24 positions shown; findings below may reference images not displayed]

ACR Breast Density Category b: There are scattered areas of
fibroglandular density.
FINDINGS: There are no findings suspicious for malignancy.
IMPRESSION: No mammographic evidence of malignancy. A result letter of this
screening mammogram will be mailed directly to the patient.

RECOMMENDATION:
Screening mammogram in one year. (Code:51-O-LD2)

BI-RADS CATEGORY  1: Negative.

## 2022-10-06 ENCOUNTER — Encounter: Payer: Self-pay | Admitting: Family Medicine

## 2022-10-06 ENCOUNTER — Ambulatory Visit (INDEPENDENT_AMBULATORY_CARE_PROVIDER_SITE_OTHER): Payer: BC Managed Care – PPO | Admitting: Family Medicine

## 2022-10-06 ENCOUNTER — Other Ambulatory Visit: Payer: Self-pay | Admitting: Obstetrics and Gynecology

## 2022-10-06 VITALS — BP 122/80 | HR 84 | Temp 97.6°F | Ht 62.0 in | Wt 183.5 lb

## 2022-10-06 DIAGNOSIS — Z0000 Encounter for general adult medical examination without abnormal findings: Secondary | ICD-10-CM

## 2022-10-06 DIAGNOSIS — R7303 Prediabetes: Secondary | ICD-10-CM | POA: Diagnosis not present

## 2022-10-06 DIAGNOSIS — E7800 Pure hypercholesterolemia, unspecified: Secondary | ICD-10-CM | POA: Diagnosis not present

## 2022-10-06 DIAGNOSIS — Z1231 Encounter for screening mammogram for malignant neoplasm of breast: Secondary | ICD-10-CM

## 2022-10-06 LAB — CBC WITH DIFFERENTIAL/PLATELET
Basophils Absolute: 0 10*3/uL (ref 0.0–0.1)
Basophils Relative: 0.6 % (ref 0.0–3.0)
Eosinophils Absolute: 0.2 10*3/uL (ref 0.0–0.7)
Eosinophils Relative: 2.6 % (ref 0.0–5.0)
HCT: 41.2 % (ref 36.0–46.0)
Hemoglobin: 13.8 g/dL (ref 12.0–15.0)
Lymphocytes Relative: 35.1 % (ref 12.0–46.0)
Lymphs Abs: 2.1 10*3/uL (ref 0.7–4.0)
MCHC: 33.5 g/dL (ref 30.0–36.0)
MCV: 89.7 fl (ref 78.0–100.0)
Monocytes Absolute: 0.4 10*3/uL (ref 0.1–1.0)
Monocytes Relative: 6.3 % (ref 3.0–12.0)
Neutro Abs: 3.3 10*3/uL (ref 1.4–7.7)
Neutrophils Relative %: 55.4 % (ref 43.0–77.0)
Platelets: 221 10*3/uL (ref 150.0–400.0)
RBC: 4.6 Mil/uL (ref 3.87–5.11)
RDW: 13.6 % (ref 11.5–15.5)
WBC: 5.9 10*3/uL (ref 4.0–10.5)

## 2022-10-06 LAB — COMPREHENSIVE METABOLIC PANEL
ALT: 19 U/L (ref 0–35)
AST: 20 U/L (ref 0–37)
Albumin: 4.1 g/dL (ref 3.5–5.2)
Alkaline Phosphatase: 59 U/L (ref 39–117)
BUN: 14 mg/dL (ref 6–23)
CO2: 27 mEq/L (ref 19–32)
Calcium: 9.4 mg/dL (ref 8.4–10.5)
Chloride: 103 mEq/L (ref 96–112)
Creatinine, Ser: 0.82 mg/dL (ref 0.40–1.20)
GFR: 77.43 mL/min (ref >60.00)
Glucose, Bld: 110 mg/dL — ABNORMAL HIGH (ref 70–99)
Potassium: 4 mEq/L (ref 3.5–5.1)
Sodium: 141 mEq/L (ref 135–145)
Total Bilirubin: 0.8 mg/dL (ref 0.2–1.2)
Total Protein: 6.6 g/dL (ref 6.0–8.3)

## 2022-10-06 LAB — LIPID PANEL
Cholesterol: 156 mg/dL (ref 0–200)
HDL: 61.6 mg/dL (ref >39.00)
LDL Cholesterol: 72 mg/dL (ref 0–99)
NonHDL: 94.63
Total CHOL/HDL Ratio: 3
Triglycerides: 113 mg/dL (ref 0.0–149.0)
VLDL: 22.6 mg/dL (ref 0.0–40.0)

## 2022-10-06 LAB — TSH: TSH: 1.83 u[IU]/mL (ref 0.35–5.50)

## 2022-10-06 LAB — HEMOGLOBIN A1C: Hgb A1c MFr Bld: 6.5 % (ref 4.6–6.5)

## 2022-10-06 MED ORDER — BUSPIRONE HCL 5 MG PO TABS: 5.0000 mg | ORAL_TABLET | Freq: Two times a day (BID) | ORAL | 1 refills | Status: DC

## 2022-10-06 NOTE — Patient Instructions (Addendum)
It was very nice to see you today!  Get someone to care for husband at night!  EXERCISE!!!!   PLEASE NOTE:  If you had any lab tests please let us know if you have not heard back within a few days. You may see your results on MyChart before we have a chance to review them but we will give you a call once they are reviewed by [place]. If we ordered any referrals today, please let us know if you have not heard from their office within the next week.   Please try these tips to maintain a healthy lifestyle:  Eat most of your calories during the day when you are active. Eliminate processed foods including packaged sweets (pies, cakes, cookies), reduce intake of potatoes, white bread, white pasta, and white rice. Look for whole grain options, oat flour or almond flour.  Each meal should contain half fruits/vegetables, one quarter protein, and one quarter carbs (no bigger than a computer mouse).  Cut down on sweet beverages. This includes juice, soda, and sweet tea. Also watch fruit intake, though this is a healthier sweet option, it still contains natural sugar! Limit to 3 servings daily.  Drink at least 1 glass of water with each meal and aim for at least 8 glasses per day  Exercise at least 150 minutes every week.

## 2022-10-06 NOTE — Progress Notes (Signed)
Phone [phone]   Subjective:   Patient is a 62 y.o. female presenting for annual physical.    Chief Complaint  Patient presents with   Annual Exam    CPE  Fasting Having trouble sleeping   Annual-pap in May.  Some exercise.    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  Buspar helps moods a lot.  No SI  The following were reviewed and entered/updated in epic: Past Medical History:  Diagnosis Date   Abnormal Pap smear of cervix    --age 56 had precancerous changes on pap and had hysterectomy   Alopecia    Anemia    Anxiety    Diverticulitis    Endometriosis    Family history of kidney cancer    Family history of ovarian cancer    Family history of prostate cancer    Family history of uterine cancer    High cholesterol    Migraines    some w/aura   Prediabetes    Renal stones    Uterine cancer ([place])    dx at 78   Patient Active Problem List   Diagnosis Date Noted   Prediabetes 05/22/2022   Hyperlipidemia 05/04/2019   Migraines    Anxiety    Genetic testing 05/26/2017   Family history of uterine cancer    Family history of ovarian cancer    Family history of kidney cancer    Family history of prostate cancer    Uterine cancer ([place])    Past Surgical History:  Procedure Laterality Date   ABDOMINAL HYSTERECTOMY     L-TAH--ovaries remain--uterine   left kidney blockage Left    age [age], and age 2   TONSILLECTOMY AND ADENOIDECTOMY     -age 68   urethral stent Left    age 28     Family History  Problem Relation Age of Onset   Diabetes Mother 59   Other Mother        passed away from a blood clot after sinus surgery   Migraines Mother    COPD Father 71   Diabetes Father    Diabetes Sister    Cancer Maternal Grandfather 10       dec metastatic prostate ca   Leukemia Paternal Aunt    Cancer Cousin        uterine and ovarian   Cancer Cousin        uterine and ovarian   Kidney cancer Paternal Uncle        dx in his late 44s-30s   Glomerulonephritis Paternal Uncle     Medications- reviewed and updated Current Outpatient Medications  Medication Sig Dispense Refill   atorvastatin (LIPITOR) 40 MG tablet TAKE 1 TABLET(40 MG) BY MOUTH AT BEDTIME 90 tablet 3   Calcium Carb-Cholecalciferol (CALCIUM 1000 + D PO)      citalopram (CELEXA) 40 MG tablet TAKE 1 TABLET(40 MG) BY MOUTH AT BEDTIME 90 tablet 3   Cyanocobalamin (VITAMIN B-12 PO) Take 1 tablet by mouth every other day. 3 times a week  diphenhydrAMINE (BENADRYL) 25 MG tablet Take 25 mg by mouth at bedtime as needed.     eletriptan (RELPAX) 40 MG tablet Take 1 tablet (40 mg total) by mouth as needed for migraine or headache. May repeat in 2 hours if headache persists or recurs. 10 tablet 0   estradiol (ESTRACE) 0.1 MG/GM vaginal cream Use 1/2 g vaginally two or three times per week as needed to maintain symptom relief. 42.5 g 1   Lansoprazole (PREVACID PO) Take 1 tablet by mouth daily.     loratadine (CLARITIN) 10 MG tablet Take 10 mg by mouth daily.     MELATONIN PO Take 10 mg by mouth at bedtime.     Multiple Vitamin (MULTIVITAMIN) capsule Take 1 capsule by mouth daily.     nystatin powder APPLY TOPICALLY TO THE AFFECTED AREA THREE TIMES DAILY FOR UP TO 7 DAYS 30 g 3   Polyethyl Glycol-Propyl Glycol 0.4-0.3 % SOLN Place 1 drop into both eyes 2 (two) times daily.     busPIRone (BUSPAR) 5 MG tablet Take 1 tablet (5 mg total) by mouth 2 (two) times daily. 180 tablet 1   Hydrocortisone Acetate 1 % CREA  Apply topically. (Patient not taking: Reported on 10/06/2022)     No current facility-administered medications for this visit.    Allergies-reviewed and updated Allergies  Allergen Reactions   Ivp Dye [Iodinated Contrast Media] Other (See Comments)    seizures    Social History   Social History Narrative   Daughter, 1 grand son.   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 DP 2+B LUNGS: CTAB. No wheezes ABDOMEN:  BS+, soft, NTND, No HSM, no masses EXT:  no edema MSK: no gross abnormalities. MS 5/5 all 4 NEURO: A&O x3.  CN II-XII intact.  PSYCH: normal mood. Good eye contact    Assessment and Plan   Health Maintenance counseling: 1. Anticipatory guidance: Patient counseled regarding regular dental exams q6 months, eye exams,  avoiding smoking and second hand smoke, limiting alcohol to 1 beverage per day, no illicit drugs.   2. Risk factor reduction:  Advised patient of need for regular exercise and diet rich and fruits and vegetables to reduce risk of heart attack and stroke. Exercise- work on it.  Wt Readings from Last 3 Encounters:  10/06/22 183 lb 8 oz (83.2 kg)  05/22/22 179 lb 4 oz (81.3 kg)  11/05/21 178 lb (80.7 kg)   3. Immunizations/screenings/ancillary studies Immunization History  Administered Date(s) Administered   Influenza,inj,Quad PF,6+ Mos 05/27/2017, 04/15/2018, 04/28/2019   Influenza-Unspecified 04/27/2019, 06/05/2021, 03/29/2022   PFIZER(Purple Top)SARS-COV-2 Vaccination 08/08/2019, 08/30/2019, 05/19/2020   Tdap 02/01/2015   Zoster Recombinat (Shingrix) 05/20/2018, 01/24/2020   Health Maintenance  Due  Topic Date Due   PAP SMEAR-Modifier  07/11/2022    4. Cervical cancer screening- sch May 5. Breast cancer screening-  mammogram utd 6. Colon cancer screening - utd 7. Skin cancer screening- advised regular sunscreen use. Denies worrisome, changing, or new skin lesions.  8. Birth control/STD check- n/a 9. Osteoporosis  screening- n/a 10. Smoking associated screening - non smoker  Problem List Items Addressed This Visit       Other   Hyperlipidemia   Relevant Orders   Lipid panel   Prediabetes   Relevant Orders   Hemoglobin A1c   Other Visit Diagnoses     Wellness examination    -  Primary   Relevant Orders   Comprehensive metabolic panel   Hemoglobin A1c   Lipid panel   TSH   CBC with Differential/Platelet      Wellness-anticipatory guidance.  Work on [employer].  Check CBC,CMP,lipids,TSH, A1C.  F/u 1 yr  HLD-chronic.  Check lipids PreDM-work on diet/exercise.  Check A1C F/u 6 mo chronic med problems  Recommended follow up: [facility] in about 6 months (around 04/08/2023) for pre dm, hld,insomnia, moods. Future Appointments  Date Time Provider [place] 12/23/2022  3:00 PM ANunzio Cobbs MD [facility] None    Lab/Order associations:+ fasting   ICD-10-CM   1. Wellness examination  Z00.00 Comprehensive metabolic panel    Hemoglobin A1c    Lipid panel    TSH    CBC with Differential/Platelet    2. Pure hypercholesterolemia  E78.00 Lipid panel    3. Prediabetes  R73.03 Hemoglobin A1c      Meds ordered this encounter  Medications   busPIRone (BUSPAR) 5 MG tablet    Sig: Take 1 tablet (5 mg total) by mouth 2 (two) times daily.    Dispense:  180 tablet    Refill:  1    AWellington Hampshire MD

## 2022-10-07 NOTE — Progress Notes (Signed)
Labs look great except sugars A1c is 6.5 which technically has turned the corner to diabetes.  Of course, needs to work harder on diet/exercise.  Does she want to add metformin 500 mg at breakfast and supper?  If so, send in prescription.  (May potentially cause some diarrhea) regardless, diet/exercise

## 2022-10-10 ENCOUNTER — Encounter: Payer: Self-pay | Admitting: Family Medicine

## 2022-10-11 ENCOUNTER — Encounter: Payer: Self-pay | Admitting: Family Medicine

## 2022-10-12 ENCOUNTER — Other Ambulatory Visit: Payer: Self-pay | Admitting: *Deleted

## 2022-10-12 MED ORDER — METFORMIN HCL ER 500 MG PO TB24: 500.0000 mg | ORAL_TABLET | Freq: Every day | ORAL | 1 refills | Status: DC

## 2022-10-12 NOTE — Telephone Encounter (Signed)
Rx sent to the pharmacy.

## 2022-11-01 ENCOUNTER — Encounter: Payer: Self-pay | Admitting: Family Medicine

## 2022-11-18 ENCOUNTER — Ambulatory Visit
Admission: RE | Admit: 2022-11-18 | Discharge: 2022-11-18 | Disposition: A | Discharge status: Home / Self Care | Payer: BC Managed Care – PPO | Source: Ambulatory Visit | Attending: Obstetrics and Gynecology | Admitting: Obstetrics and Gynecology

## 2022-11-18 ENCOUNTER — Other Ambulatory Visit: Payer: Self-pay | Admitting: Family Medicine

## 2022-11-18 DIAGNOSIS — Z1231 Encounter for screening mammogram for malignant neoplasm of breast: Secondary | ICD-10-CM

## 2022-12-01 ENCOUNTER — Other Ambulatory Visit: Payer: Self-pay | Admitting: Family Medicine

## 2022-12-01 ENCOUNTER — Encounter: Payer: Self-pay | Admitting: Family Medicine

## 2022-12-01 MED ORDER — METFORMIN HCL ER 500 MG PO TB24: 500.0000 mg | ORAL_TABLET | Freq: Two times a day (BID) | ORAL | 1 refills | Status: DC

## 2022-12-23 ENCOUNTER — Ambulatory Visit: Payer: BC Managed Care – PPO | Admitting: Obstetrics and Gynecology

## 2022-12-23 NOTE — Progress Notes (Signed)
62 y.o. [id number] Married Caucasian female here for annual exam.    Using a ring with support pessary and vaginal estrogen cream 1 gram once a week. Takes pessary out once a week.  She has two, and is not certain if the size is appropriate.  (Her other pessary is smaller.) Her pessary drops down when she has a BM, but it does not fall out.  The pessary does not improve her bowel movements.  With voiding, she has to position herself differently.  Urine flows more with the pessary out.  No urinary leakage with the pessary in place unless she sneezes and her bladder is full.  This is a small amount of leakage.  No pain or bleeding with her pessary use.   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  PCP:   [facility]   No LMP recorded. Patient has had a hysterectomy.           Sexually active: No.  The current method of family planning is status post hysterectomy.    Exercising: Yes.     3x a week, treadmill Smoker:  no  Health Maintenance: Pap:  07/12/19 neg: HR HPV neg History of abnormal Pap:  yes, at 62 y.o. had pre-cancerous cells on pap which lead to hysterectomy.  MMG:  11/18/22 Breast Density Cat B, BI-RADS CAT 1 neg Colonoscopy:  01-09-14 normal except for diverticulitis;next 12/2023.  BMD:   2010  Result  normal TDaP:  n/a Gardasil:   no HIV: PCP Hep C: 04/14/19 NR Screening Labs:  PCP   reports that she has never smoked. She has never used smokeless tobacco. She reports that she does not currently use alcohol. She reports that she does not use drugs.  Past Medical History:  Diagnosis Date   Abnormal Pap smear of cervix    --age 71 had precancerous changes on pap and had hysterectomy   Alopecia    Anemia    Anxiety    Diverticulitis    Endometriosis    Family history of kidney cancer    Family history of ovarian cancer    Family history of prostate cancer    Family history of uterine cancer    High cholesterol    Migraines    some w/aura   Prediabetes     Renal stones    Uterine cancer ([facility])    dx at 21    Past Surgical History:  Procedure Laterality Date   ABDOMINAL HYSTERECTOMY     L-TAH--ovaries remain--uterine   left kidney blockage Left    age [age], and age 70   TONSILLECTOMY AND ADENOIDECTOMY     -age 40   urethral stent Left    age 35    Current Outpatient Medications  Medication Sig Dispense Refill   atorvastatin (LIPITOR) 40 MG tablet TAKE 1 TABLET(40 MG) BY MOUTH AT BEDTIME 90 tablet 3   busPIRone (BUSPAR) 5 MG tablet TAKE 1 TABLET(5 MG) BY MOUTH TWICE DAILY 180 tablet 1   Calcium Carb-Cholecalciferol (CALCIUM 1000 + D PO)      citalopram (CELEXA) 40 MG tablet TAKE 1 TABLET(40 MG) BY MOUTH AT BEDTIME 90 tablet 3   Cyanocobalamin (VITAMIN B-12 PO) Take 1 tablet by mouth every other day. 3 times a week     diphenhydrAMINE (BENADRYL) 25 MG tablet Take 25 mg by mouth at bedtime as needed.     eletriptan (RELPAX) 40 MG tablet Take 1 tablet (40 mg total) by mouth as needed  for migraine or headache. May repeat in 2 hours if headache persists or recurs. 10 tablet 0   estradiol (ESTRACE) 0.1 MG/GM vaginal cream Use 1/2 g vaginally two or three times per week as needed to maintain symptom relief. 42.5 g 1   fluticasone (FLONASE) 50 MCG/ACT nasal spray Place into both nostrils daily.     Hydrocortisone Acetate 1 % CREA Apply topically.     Lansoprazole (PREVACID PO) Take 1 tablet by mouth daily.     loratadine (CLARITIN) 10 MG tablet Take 10 mg by mouth daily.     MELATONIN PO Take 10 mg by mouth at bedtime.     metFORMIN (GLUCOPHAGE-XR) 500 MG 24 hr tablet Take 1 tablet (500 mg total) by mouth 2 (two) times daily with a meal. 180 tablet 1   Multiple Vitamin (MULTIVITAMIN) capsule Take 1 capsule by mouth daily.     nystatin powder APPLY TOPICALLY TO THE AFFECTED AREA THREE TIMES DAILY FOR UP TO 7 DAYS 30 g 3   Polyethyl Glycol-Propyl Glycol 0.4-0.3 % SOLN Place 1 drop into both eyes 2 (two) times daily.     No current  facility-administered medications for this visit.    Family History  Problem Relation Age of Onset   Diabetes Mother 90   Other Mother        passed away from a blood clot after sinus surgery   Migraines Mother    COPD Father 56   Diabetes Father    Diabetes Sister    Cancer Maternal Grandfather 25       dec metastatic prostate ca   Leukemia Paternal Aunt    Kidney cancer Paternal Uncle        dx in his late 35s-30s   Glomerulonephritis Paternal Uncle    Cancer Cousin        uterine and ovarian   Cancer Cousin        uterine and ovarian   Hypertension Daughter     Review of Systems  All other systems reviewed and are negative.   Exam:   BP 128/82 (BP Location: Right Arm, Patient Position: Sitting, Cuff Size: Normal)   Pulse 92   Ht 5' 2.5" (1.588 m)   Wt 175 lb (79.4 kg)   SpO2 97%   BMI 31.50 kg/m     General appearance: alert, cooperative and appears stated age Head: normocephalic, without obvious abnormality, atraumatic Neck: no adenopathy, supple, symmetrical, trachea midline and thyroid normal to inspection and palpation Lungs: clear to auscultation bilaterally Breasts: normal appearance, no masses or tenderness, No nipple retraction or dimpling, No nipple discharge or bleeding, No axillary adenopathy Heart: regular rate and rhythm Abdomen: soft, non-tender; no masses, no organomegaly Extremities: extremities normal, atraumatic, no cyanosis or edema Skin: skin color, texture, turgor normal. No rashes or lesions Lymph nodes: cervical, supraclavicular, and axillary nodes normal. Neurologic: grossly normal  Pelvic: External genitalia:  no lesions              No abnormal inguinal nodes palpated.              Urethra:  normal appearing urethra with no masses, tenderness or lesions              Bartholins and Skenes: normal                 Vagina: normal appearing vagina with normal color and discharge, no lesions  Cervix: absent              Pap  taken: no Bimanual Exam:  Uterus:  absent              Adnexa: no mass, fullness, tenderness              Rectal exam: yes.  Confirms.              Anus:  normal sphincter tone, no lesions  Pessary ring with support removed, cleansed, and replaced.  Rectocele prolapses slightly beyond the pessary.  Chaperone was present for exam:  Warren Lacy, CMA  Assessment:   Well woman visit with gynecologic exam. Status post hysterectomy for cervical dysplasia age 47.  Ovaries remain.  Hx GSI.  Mild. Rectocele. Pessary use.  Paternal family with cancer history.  Negative genetic testing.  Had POLE VUS.   Plan: Mammogram screening discussed. Self breast awareness reviewed. Pap and HR HPV as above. Guidelines for Calcium, Vitamin D, regular exercise program including cardiovascular and weight bearing exercise. Refill of vaginal estrogen cream 1 gram pv twice weekly.  I discussed potential effect effect on breast cancer.  Follow up annually and prn.   After visit summary provided.

## 2023-01-04 ENCOUNTER — Encounter: Payer: Self-pay | Admitting: Family Medicine

## 2023-01-04 ENCOUNTER — Other Ambulatory Visit: Payer: Self-pay | Admitting: *Deleted

## 2023-01-05 ENCOUNTER — Telehealth: Payer: Self-pay | Admitting: Family Medicine

## 2023-01-05 ENCOUNTER — Encounter: Payer: Self-pay | Admitting: Obstetrics and Gynecology

## 2023-01-05 ENCOUNTER — Other Ambulatory Visit: Payer: Self-pay | Admitting: Family Medicine

## 2023-01-05 ENCOUNTER — Ambulatory Visit (INDEPENDENT_AMBULATORY_CARE_PROVIDER_SITE_OTHER): Payer: BC Managed Care – PPO | Admitting: Obstetrics and Gynecology

## 2023-01-05 VITALS — BP 128/82 | HR 92 | Ht 62.5 in | Wt 175.0 lb

## 2023-01-05 DIAGNOSIS — Z4689 Encounter for fitting and adjustment of other specified devices: Secondary | ICD-10-CM

## 2023-01-05 DIAGNOSIS — N816 Rectocele: Secondary | ICD-10-CM | POA: Diagnosis not present

## 2023-01-05 DIAGNOSIS — Z01419 Encounter for gynecological examination (general) (routine) without abnormal findings: Secondary | ICD-10-CM | POA: Diagnosis not present

## 2023-01-05 MED ORDER — ELETRIPTAN HYDROBROMIDE 40 MG PO TABS: 40.0000 mg | ORAL_TABLET | ORAL | 3 refills | Status: AC | PRN

## 2023-01-05 MED ORDER — ESTRADIOL 0.1 MG/GM VA CREA: TOPICAL_CREAM | VAGINAL | 1 refills | Status: AC

## 2023-01-05 NOTE — Telephone Encounter (Signed)
Patient would like to transition from Dr. Ruthine Dose to [facility]. She said the [facility] location is close to her job. Best callback is [phone].

## 2023-01-05 NOTE — Patient Instructions (Signed)

## 2023-01-06 NOTE — Telephone Encounter (Signed)
LVM to get scheduled.  

## 2023-03-11 ENCOUNTER — Encounter (INDEPENDENT_AMBULATORY_CARE_PROVIDER_SITE_OTHER): Payer: Self-pay

## 2023-03-16 ENCOUNTER — Encounter: Payer: Self-pay | Admitting: Family Medicine

## 2023-03-16 ENCOUNTER — Ambulatory Visit: Payer: BC Managed Care – PPO | Admitting: Family Medicine

## 2023-03-16 VITALS — BP 114/78 | HR 75 | Temp 97.6°F | Ht 62.5 in | Wt 175.0 lb

## 2023-03-16 DIAGNOSIS — K219 Gastro-esophageal reflux disease without esophagitis: Secondary | ICD-10-CM

## 2023-03-16 DIAGNOSIS — J3089 Other allergic rhinitis: Secondary | ICD-10-CM

## 2023-03-16 DIAGNOSIS — G43009 Migraine without aura, not intractable, without status migrainosus: Secondary | ICD-10-CM | POA: Diagnosis not present

## 2023-03-16 DIAGNOSIS — E7800 Pure hypercholesterolemia, unspecified: Secondary | ICD-10-CM | POA: Diagnosis not present

## 2023-03-16 DIAGNOSIS — Z7689 Persons encountering health services in other specified circumstances: Secondary | ICD-10-CM

## 2023-03-16 DIAGNOSIS — R7303 Prediabetes: Secondary | ICD-10-CM | POA: Diagnosis not present

## 2023-03-16 DIAGNOSIS — F419 Anxiety disorder, unspecified: Secondary | ICD-10-CM | POA: Diagnosis not present

## 2023-03-16 LAB — POCT GLYCOSYLATED HEMOGLOBIN (HGB A1C): Hemoglobin A1C: 5.9 % — AB (ref 4.0–5.6)

## 2023-03-16 NOTE — Assessment & Plan Note (Signed)
Continue Prevacid.

## 2023-03-16 NOTE — Assessment & Plan Note (Signed)
Continue atorvastatin. Lipids in March in goal range. Continue healthy diet and exercise.

## 2023-03-16 NOTE — Assessment & Plan Note (Signed)
Doing well on current medications. Continue Buspar bid and Celexa daily

## 2023-03-16 NOTE — Patient Instructions (Signed)
Your hemoglobin A1c is 5.9% and your blood sugars are well controlled.   Keep up the good work with diet and exercise.   Limit potatoes, pasta, bread, rice and sugar.   Cut back to once daily metformin (with breakfast).

## 2023-03-16 NOTE — Assessment & Plan Note (Signed)
Controlled.  

## 2023-03-16 NOTE — Assessment & Plan Note (Signed)
Continue medications

## 2023-03-16 NOTE — Progress Notes (Signed)
New Patient Office Visit  Subjective    Patient ID: Maria Gross, female    DOB: 31-Mar-1961  Age: 62 y.o. MRN: [id number]  CC:  Chief Complaint  Patient presents with   Establish Care    No concerns, needed new PCP closer.   Started on Metformin in March and wanted to see if it has been working. States it makes her nauseous but knows that is side effect but has lost some weight on it as well    HPI [name] presents to establish care Previous PCP at [facility]  OB/GYN- Dr. Edward Jolly   A1c 6.5%  Started metformin in March. Taking it twice daily   Checks her blood sugars at home sporadically   Added 20 minutes of exercise daily.  She cut back on sugar and carbohydrates.   Taking atorvastatin 40 mg daily.   Using Flonase for allergies and this has cut back on headaches.   Takes Benadryl at bedtime and Claritin  She saw an allergist many years ago in [place] and had allergy.   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 3 times a week   diphenhydrAMINE (BENADRYL) 25 MG tablet Take 25 mg by mouth at bedtime as needed.   eletriptan (RELPAX) 40 MG tablet Take 1 tablet (40 mg total) by mouth as needed for migraine or headache. May repeat in 2 hours if headache persists or recurs.   estradiol (ESTRACE) 0.1 MG/GM vaginal cream Use 1 g vaginally two times per week as needed to maintain symptom relief.    fluticasone (FLONASE) 50 MCG/ACT nasal spray Place into both nostrils daily.   Lansoprazole (PREVACID PO) Take 1 tablet by mouth daily.   loratadine (CLARITIN) 10 MG tablet Take 10 mg by mouth daily.   MELATONIN PO Take 10 mg by mouth at bedtime.   metFORMIN (GLUCOPHAGE-XR) 500 MG 24 hr tablet Take 1 tablet (500 mg total) by mouth 2 (two) times daily with a meal.   Multiple Vitamin (MULTIVITAMIN) capsule Take 1 capsule by mouth daily.   Polyethyl Glycol-Propyl Glycol 0.4-0.3 % SOLN Place 1 drop into both eyes 2 (two) times daily.   Hydrocortisone Acetate 1 % CREA Apply topically.   nystatin powder APPLY TOPICALLY TO THE AFFECTED AREA THREE TIMES DAILY FOR UP TO 7 DAYS (Patient not taking: Reported on 03/16/2023)   No facility-administered encounter medications on file as of 03/16/2023.    Past Medical History:  Diagnosis Date   Abnormal Pap smear of cervix    --age 62 had precancerous changes on pap and had hysterectomy   Alopecia    Anemia    Anxiety    Diverticulitis    Endometriosis    Family history of kidney cancer    Family history of ovarian cancer    Family history of prostate cancer    Family history  of uterine cancer    High cholesterol    Migraines    some w/aura   Prediabetes    Renal stones    Uterine cancer ([facility])    dx at 36    Past Surgical History:  Procedure Laterality Date   ABDOMINAL HYSTERECTOMY     L-TAH--ovaries remain--uterine   left kidney blockage Left    age [age], and age 12   TONSILLECTOMY AND ADENOIDECTOMY     -age 64   urethral stent Left    age 89    Family History  Problem Relation Age of Onset   Diabetes Mother 1   Other Mother        passed away from a blood clot after sinus surgery   Migraines Mother    COPD Father 22   Diabetes Father    Diabetes Sister    Cancer Maternal Grandfather 11       dec metastatic prostate ca   Leukemia Paternal Aunt    Kidney cancer Paternal Uncle        dx in his late 83s-30s   Glomerulonephritis  Paternal Uncle    Cancer Cousin        uterine and ovarian   Cancer Cousin        uterine and ovarian   Hypertension Daughter     Social History   Socioeconomic History   Marital status: Married    Spouse name: Not on file   Number of children: 1   Years of education: Not on file   Highest education level: Not on file  Occupational History   Not on file  Tobacco Use   Smoking status: Never   Smokeless tobacco: Never  Vaping Use   Vaping status: Never Used  Substance and Sexual Activity   Alcohol use: Not Currently    Alcohol/week: 0.0 standard drinks of alcohol   Drug use: No   Sexual activity: Not Currently    Partners: Male    Comment: Hyst  Other Topics Concern   Not on file  Social History Narrative   Daughter, 1 grand son.   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  Respiratory:  Negative for shortness of breath.   Cardiovascular:  Negative for chest pain, palpitations and leg swelling.  Gastrointestinal:  Negative for abdominal pain, constipation, diarrhea, nausea and vomiting.  Genitourinary:  Negative for dysuria, frequency and urgency.  Neurological:  Negative for dizziness.        Objective    BP 114/78 (BP Location: Left Arm, Patient Position: Sitting, Cuff Size: Large)   Pulse 75   Temp 97.6 F (36.4 C) (Temporal)   Ht 5' 2.5" (1.588 m)   Wt 175 lb (79.4 kg)   SpO2 95%   BMI 31.50 kg/m   Physical Exam Constitutional:      General: She is not in acute distress.    Appearance: She is not ill-appearing.  HENT:     Mouth/Throat:     Mouth: Mucous membranes are moist.     Pharynx: Oropharynx is clear.  Eyes:      Extraocular Movements: Extraocular movements intact.  Conjunctiva/sclera: Conjunctivae normal.  Cardiovascular:     Rate and Rhythm: Normal rate and regular rhythm.  Pulmonary:     Effort: Pulmonary effort is normal.     Breath sounds: Normal breath sounds.  Musculoskeletal:     Cervical back: Normal range of motion and neck supple.     Right lower leg: No edema.     Left lower leg: No edema.  Skin:    General: Skin is warm and dry.  Neurological:     General: No focal deficit present.     Mental Status: She is alert and oriented to person, place, and time.  Psychiatric:        Mood and Affect: Mood normal.        Behavior: Behavior normal.        Thought Content: Thought content normal.         Assessment & Plan:   Problem List Items Addressed This Visit       Cardiovascular and Mediastinum   Migraines    Controlled         Digestive   Gastroesophageal reflux disease without esophagitis    Continue Prevacid         Other   Anxiety    Doing well on current medications. Continue Buspar bid and Celexa daily       Environmental and seasonal allergies    Continue medications      Hyperlipidemia    Continue atorvastatin. Lipids in March in goal range. Continue healthy diet and exercise.       Prediabetes - Primary    Hgb A1c 5.9% today. May reduce metformin to daily with breakfast. Continue healthy diet and exercise.       Relevant Orders   POCT glycosylated hemoglobin (Hb A1C) (Completed)   Other Visit Diagnoses     Encounter to establish care           Return in about 3 months (around 06/16/2023) for Fasting follow up.   Hetty Blend, NP-C

## 2023-03-16 NOTE — Assessment & Plan Note (Signed)
Hgb A1c 5.9% today. May reduce metformin to daily with breakfast. Continue healthy diet and exercise.

## 2023-04-08 ENCOUNTER — Ambulatory Visit: Payer: BC Managed Care – PPO | Admitting: Family Medicine

## 2023-04-17 ENCOUNTER — Encounter: Payer: Self-pay | Admitting: Family Medicine

## 2023-04-19 NOTE — Telephone Encounter (Signed)
FYI

## 2023-04-26 ENCOUNTER — Encounter: Payer: Self-pay | Admitting: Family Medicine

## 2023-05-21 ENCOUNTER — Other Ambulatory Visit: Payer: Self-pay | Admitting: Family Medicine

## 2023-05-30 ENCOUNTER — Other Ambulatory Visit: Payer: Self-pay | Admitting: Family Medicine

## 2023-06-15 DIAGNOSIS — E66811 Obesity, class 1: Secondary | ICD-10-CM | POA: Insufficient documentation

## 2023-06-15 NOTE — Progress Notes (Unsigned)
Subjective:     Patient ID: Maria Gross, female    DOB: 16-Dec-1960, 62 y.o.   MRN: [id number]  No chief complaint on file.   HPI  History of Present Illness         She is here for a 3 month follow up visit. This is her 2nd visit with me.   Sees OB/GYN.   HLD- taking Liptor 40 mg   Prediabetes - A1c 5.9%  Taking metformin  GERD- taking Pevacid  Allergies-   Anxiety- Buspar and Celexa        Health Maintenance Due  Topic Date Due   COVID-19 Vaccine (4 - 2023-24 season) 03/28/2023    Past Medical History:  Diagnosis Date   Abnormal Pap smear of cervix    --age 61 had precancerous changes on pap and had hysterectomy   Alopecia    Anemia    Anxiety    Diverticulitis    Endometriosis    Family history of kidney cancer    Family history of ovarian cancer    Family history of prostate cancer    Family history of uterine cancer    High cholesterol    Migraines    some w/aura   Prediabetes    Renal stones    Uterine cancer (HCC)    dx at 8    Past Surgical History:  Procedure Laterality Date   ABDOMINAL HYSTERECTOMY     L-TAH--ovaries remain--uterine   left kidney blockage Left    age [age], and age 22   TONSILLECTOMY AND ADENOIDECTOMY     -age 49   urethral stent Left    age 59    Family History  Problem Relation Age of Onset   Diabetes Mother 32   Other Mother        passed away from a blood clot after sinus surgery   Migraines Mother    COPD Father 33   Diabetes Father    Diabetes Sister    Cancer Maternal Grandfather 73       dec metastatic prostate ca   Leukemia Paternal Aunt    Kidney cancer Paternal Uncle        dx in his late 81s-30s   Glomerulonephritis Paternal Uncle    Cancer Cousin        uterine and ovarian   Cancer Cousin        uterine and ovarian   Hypertension Daughter     Social History   Socioeconomic History   Marital status: Married    Spouse name: Not on file   Number of children: 1   Years of  education: Not on file   Highest education level: Not on file  Occupational History   Not on file  Tobacco Use   Smoking status: Never   Smokeless tobacco: Never  Vaping Use   Vaping status: Never Used  Substance and Sexual Activity   Alcohol use: Not Currently    Alcohol/week: 0.0 standard drinks of alcohol   Drug use: No   Sexual activity: Not Currently    Partners: Male    Comment: Hyst  Other Topics Concern   Not on file  Social History Narrative   Daughter, 1 grand son.   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[employer]   Financial Resource Strain: Not on file  Food Insecurity: Not on file  Transportation Needs: Not on file  Physical Activity: Not on file  Stress: Not on file  Social Connections: Not on file  Intimate Partner Violence: Not on file    Outpatient Medications Prior to Visit  Medication Sig Dispense Refill   atorvastatin (LIPITOR) 40 MG tablet TAKE 1 TABLET(40 MG) BY MOUTH AT BEDTIME 90 tablet 3   busPIRone (BUSPAR) 5 MG tablet TAKE 1 TABLET(5 MG) BY MOUTH TWICE DAILY 180 tablet 1   Calcium Carb-Cholecalciferol (CALCIUM 1000 + D PO)      citalopram (CELEXA) 40 MG tablet TAKE 1 TABLET(40 MG) BY MOUTH AT BEDTIME 90 tablet 3   Cyanocobalamin (VITAMIN B-12 PO) Take 1 tablet by mouth every other day. 3 times a week     diphenhydrAMINE (BENADRYL) 25 MG tablet Take 25 mg by mouth at bedtime as needed.     eletriptan (RELPAX) 40 MG tablet Take 1 tablet (40 mg total) by mouth as needed for migraine or headache. May repeat in 2 hours if headache persists or recurs. 10 tablet 3   estradiol (ESTRACE) 0.1 MG/GM vaginal cream Use 1 g vaginally two times per week as needed to maintain symptom relief. 42.5 g 1   fluticasone (FLONASE) 50 MCG/ACT nasal spray Place into both nostrils daily.     Hydrocortisone Acetate 1 % CREA Apply topically.     Lansoprazole (PREVACID PO) Take 1 tablet by mouth daily.     loratadine (CLARITIN) 10 MG tablet Take 10 mg by mouth  daily.     MELATONIN PO Take 10 mg by mouth at bedtime.     metFORMIN (GLUCOPHAGE-XR) 500 MG 24 hr tablet TAKE 1 TABLET(500 MG) BY MOUTH TWICE DAILY WITH A MEAL 180 tablet 1   Multiple Vitamin (MULTIVITAMIN) capsule Take 1 capsule by mouth daily.     nystatin powder APPLY TOPICALLY TO THE AFFECTED AREA THREE TIMES DAILY FOR UP TO 7 DAYS (Patient not taking: Reported on 03/16/2023) 30 g 3   Polyethyl Glycol-Propyl Glycol 0.4-0.3 % SOLN Place 1 drop into both eyes 2 (two) times daily.     No facility-administered medications prior to visit.    Allergies  Allergen Reactions   Ivp Dye [Iodinated Contrast Media] Other (See Comments)    seizures    ROS     Objective:    Physical Exam   There were no vitals taken for this visit. Wt Readings from Last 3 Encounters:  03/16/23 175 lb (79.4 kg)  01/05/23 175 lb (79.4 kg)  10/06/22 183 lb 8 oz (83.2 kg)       Assessment & Plan:   Problem List Items Addressed This Visit     Anxiety   Environmental and seasonal allergies   Gastroesophageal reflux disease without esophagitis   Hyperlipidemia - Primary   Prediabetes    I am having Altamease Oiler. Griswold maintain her Cyanocobalamin (VITAMIN B-12 PO), Polyethyl Glycol-Propyl Glycol, multivitamin, loratadine, MELATONIN PO, Calcium Carb-Cholecalciferol (CALCIUM 1000 + D PO), Hydrocortisone Acetate, diphenhydrAMINE, nystatin, Lansoprazole (PREVACID PO), busPIRone, fluticasone, estradiol, eletriptan, citalopram, atorvastatin, and metFORMIN.  No orders of the defined types were placed in this encounter.

## 2023-06-16 ENCOUNTER — Encounter: Payer: Self-pay | Admitting: Family Medicine

## 2023-06-16 ENCOUNTER — Ambulatory Visit: Payer: BC Managed Care – PPO | Admitting: Family Medicine

## 2023-06-16 VITALS — BP 120/82 | HR 85 | Temp 97.6°F | Ht 62.5 in | Wt 169.0 lb

## 2023-06-16 DIAGNOSIS — J3089 Other allergic rhinitis: Secondary | ICD-10-CM

## 2023-06-16 DIAGNOSIS — R7303 Prediabetes: Secondary | ICD-10-CM | POA: Diagnosis not present

## 2023-06-16 DIAGNOSIS — F419 Anxiety disorder, unspecified: Secondary | ICD-10-CM

## 2023-06-16 DIAGNOSIS — K219 Gastro-esophageal reflux disease without esophagitis: Secondary | ICD-10-CM | POA: Diagnosis not present

## 2023-06-16 DIAGNOSIS — E7800 Pure hypercholesterolemia, unspecified: Secondary | ICD-10-CM | POA: Diagnosis not present

## 2023-06-16 DIAGNOSIS — G4700 Insomnia, unspecified: Secondary | ICD-10-CM

## 2023-06-16 DIAGNOSIS — E66811 Obesity, class 1: Secondary | ICD-10-CM

## 2023-06-16 LAB — COMPREHENSIVE METABOLIC PANEL
ALT: 18 U/L (ref 0–35)
AST: 22 U/L (ref 0–37)
Albumin: 4.6 g/dL (ref 3.5–5.2)
Alkaline Phosphatase: 59 U/L (ref 39–117)
BUN: 13 mg/dL (ref 6–23)
CO2: 31 meq/L (ref 19–32)
Calcium: 9.5 mg/dL (ref 8.4–10.5)
Chloride: 101 meq/L (ref 96–112)
Creatinine, Ser: 0.73 mg/dL (ref 0.40–1.20)
GFR: 88.59 mL/min (ref >60.00)
Glucose, Bld: 92 mg/dL (ref 70–99)
Potassium: 3.7 meq/L (ref 3.5–5.1)
Sodium: 139 meq/L (ref 135–145)
Total Bilirubin: 0.8 mg/dL (ref 0.2–1.2)
Total Protein: 7.3 g/dL (ref 6.0–8.3)

## 2023-06-16 LAB — CBC WITH DIFFERENTIAL/PLATELET
Basophils Absolute: 0 10*3/uL (ref 0.0–0.1)
Basophils Relative: 0.4 % (ref 0.0–3.0)
Eosinophils Absolute: 0.2 10*3/uL (ref 0.0–0.7)
Eosinophils Relative: 2.6 % (ref 0.0–5.0)
HCT: 40.6 % (ref 36.0–46.0)
Hemoglobin: 13.3 g/dL (ref 12.0–15.0)
Lymphocytes Relative: 40.5 % (ref 12.0–46.0)
Lymphs Abs: 2.9 10*3/uL (ref 0.7–4.0)
MCHC: 32.8 g/dL (ref 30.0–36.0)
MCV: 90.6 fL (ref 78.0–100.0)
Monocytes Absolute: 0.6 10*3/uL (ref 0.1–1.0)
Monocytes Relative: 8 % (ref 3.0–12.0)
Neutro Abs: 3.5 10*3/uL (ref 1.4–7.7)
Neutrophils Relative %: 48.5 % (ref 43.0–77.0)
Platelets: 242 10*3/uL (ref 150.0–400.0)
RBC: 4.48 Mil/uL (ref 3.87–5.11)
RDW: 13.5 % (ref 11.5–15.5)
WBC: 7.2 10*3/uL (ref 4.0–10.5)

## 2023-06-16 LAB — LIPID PANEL
Cholesterol: 157 mg/dL (ref 0–200)
HDL: 55.2 mg/dL (ref >39.00)
LDL Cholesterol: 77 mg/dL (ref 0–99)
NonHDL: 101.36
Total CHOL/HDL Ratio: 3
Triglycerides: 123 mg/dL (ref 0.0–149.0)
VLDL: 24.6 mg/dL (ref 0.0–40.0)

## 2023-06-16 LAB — HEMOGLOBIN A1C: Hgb A1c MFr Bld: 6.2 % (ref 4.6–6.5)

## 2023-06-16 MED ORDER — TRAZODONE HCL 50 MG PO TABS: 50.0000 mg | ORAL_TABLET | Freq: Every day | ORAL | 0 refills | Status: DC

## 2023-06-16 NOTE — Assessment & Plan Note (Signed)
Continue Prevacid.

## 2023-06-16 NOTE — Patient Instructions (Addendum)
Please go downstairs for labs.    Stop melatonin and try 1/2 tablet of trazodone about 1 hour before bed.

## 2023-06-16 NOTE — Assessment & Plan Note (Signed)
Continue medication regimen

## 2023-06-16 NOTE — Assessment & Plan Note (Signed)
Taking metformin twice daily.  Continue healthy diet and exercise.  Check A1c and follow-up

## 2023-06-16 NOTE — Assessment & Plan Note (Signed)
Continue statin and low-fat diet.  Check fasting lipids and follow-up

## 2023-06-16 NOTE — Assessment & Plan Note (Signed)
She has lost 6 pounds since her last visit.  Metformin is curbing her appetite.

## 2023-06-16 NOTE — Assessment & Plan Note (Signed)
Doing well on current medications. Continue Buspar bid and Celexa daily.  Reports recent irritability most likely due to not sleeping well.  Starting her on trazodone.  Consider increasing BuSpar dose at her follow-up appointment

## 2023-06-16 NOTE — Assessment & Plan Note (Signed)
Start trazodone 1/2 tablet nightly.  She will stop melatonin.  Discussed good sleep hygiene.  Follow-up in 6 months or sooner if needed.

## 2023-09-13 ENCOUNTER — Other Ambulatory Visit: Payer: Self-pay | Admitting: Family Medicine

## 2023-09-13 DIAGNOSIS — G4700 Insomnia, unspecified: Secondary | ICD-10-CM

## 2023-10-18 ENCOUNTER — Other Ambulatory Visit: Payer: Self-pay | Admitting: Obstetrics and Gynecology

## 2023-10-18 DIAGNOSIS — Z1231 Encounter for screening mammogram for malignant neoplasm of breast: Secondary | ICD-10-CM

## 2023-11-14 ENCOUNTER — Other Ambulatory Visit: Payer: Self-pay | Admitting: Family Medicine

## 2023-12-02 ENCOUNTER — Other Ambulatory Visit: Payer: Self-pay | Admitting: Family Medicine

## 2023-12-03 ENCOUNTER — Encounter (HOSPITAL_COMMUNITY): Payer: Self-pay

## 2023-12-09 ENCOUNTER — Ambulatory Visit: Payer: BC Managed Care – PPO | Admitting: Family Medicine

## 2023-12-09 ENCOUNTER — Ambulatory Visit: Payer: Self-pay | Admitting: Family Medicine

## 2023-12-09 VITALS — BP 122/84 | HR 90 | Temp 97.6°F | Ht 62.5 in | Wt 166.0 lb

## 2023-12-09 DIAGNOSIS — K219 Gastro-esophageal reflux disease without esophagitis: Secondary | ICD-10-CM

## 2023-12-09 DIAGNOSIS — G43009 Migraine without aura, not intractable, without status migrainosus: Secondary | ICD-10-CM

## 2023-12-09 DIAGNOSIS — J3089 Other allergic rhinitis: Secondary | ICD-10-CM | POA: Diagnosis not present

## 2023-12-09 DIAGNOSIS — E7800 Pure hypercholesterolemia, unspecified: Secondary | ICD-10-CM | POA: Diagnosis not present

## 2023-12-09 DIAGNOSIS — E119 Type 2 diabetes mellitus without complications: Secondary | ICD-10-CM

## 2023-12-09 DIAGNOSIS — G4700 Insomnia, unspecified: Secondary | ICD-10-CM | POA: Diagnosis not present

## 2023-12-09 DIAGNOSIS — F419 Anxiety disorder, unspecified: Secondary | ICD-10-CM

## 2023-12-09 DIAGNOSIS — Z7985 Long-term (current) use of injectable non-insulin antidiabetic drugs: Secondary | ICD-10-CM

## 2023-12-09 LAB — VITAMIN B12: Vitamin B-12: 711 pg/mL (ref 211–911)

## 2023-12-09 LAB — COMPREHENSIVE METABOLIC PANEL WITH GFR
ALT: 17 U/L (ref 0–35)
AST: 19 U/L (ref 0–37)
Albumin: 4.6 g/dL (ref 3.5–5.2)
Alkaline Phosphatase: 60 U/L (ref 39–117)
BUN: 15 mg/dL (ref 6–23)
CO2: 27 meq/L (ref 19–32)
Calcium: 9.2 mg/dL (ref 8.4–10.5)
Chloride: 103 meq/L (ref 96–112)
Creatinine, Ser: 0.87 mg/dL (ref 0.40–1.20)
GFR: 71.53 mL/min (ref >60.00)
Glucose, Bld: 101 mg/dL — ABNORMAL HIGH (ref 70–99)
Potassium: 3.6 meq/L (ref 3.5–5.1)
Sodium: 139 meq/L (ref 135–145)
Total Bilirubin: 1.1 mg/dL (ref 0.2–1.2)
Total Protein: 7.4 g/dL (ref 6.0–8.3)

## 2023-12-09 LAB — CBC WITH DIFFERENTIAL/PLATELET
Basophils Absolute: 0 10*3/uL (ref 0.0–0.1)
Basophils Relative: 0.4 % (ref 0.0–3.0)
Eosinophils Absolute: 0.2 10*3/uL (ref 0.0–0.7)
Eosinophils Relative: 3.1 % (ref 0.0–5.0)
HCT: 40.5 % (ref 36.0–46.0)
Hemoglobin: 13.5 g/dL (ref 12.0–15.0)
Lymphocytes Relative: 30.7 % (ref 12.0–46.0)
Lymphs Abs: 2.1 10*3/uL (ref 0.7–4.0)
MCHC: 33.4 g/dL (ref 30.0–36.0)
MCV: 88.4 fl (ref 78.0–100.0)
Monocytes Absolute: 0.5 10*3/uL (ref 0.1–1.0)
Monocytes Relative: 7.7 % (ref 3.0–12.0)
Neutro Abs: 3.9 10*3/uL (ref 1.4–7.7)
Neutrophils Relative %: 58.1 % (ref 43.0–77.0)
Platelets: 247 10*3/uL (ref 150.0–400.0)
RBC: 4.58 Mil/uL (ref 3.87–5.11)
RDW: 13.2 % (ref 11.5–15.5)
WBC: 6.8 10*3/uL (ref 4.0–10.5)

## 2023-12-09 LAB — HEMOGLOBIN A1C: Hgb A1c MFr Bld: 6.2 % (ref 4.6–6.5)

## 2023-12-09 LAB — T4, FREE: Free T4: 0.74 ng/dL (ref 0.60–1.60)

## 2023-12-09 LAB — TSH: TSH: 0.8 u[IU]/mL (ref 0.35–5.50)

## 2023-12-09 MED ORDER — ESZOPICLONE 1 MG PO TABS
1.0000 mg | ORAL_TABLET | Freq: Every evening | ORAL | 1 refills | Status: DC | PRN
Start: 2023-12-09 — End: 2024-02-07

## 2023-12-09 MED ORDER — TIRZEPATIDE 2.5 MG/0.5ML ~~LOC~~ SOAJ: 2.5000 mg | SUBCUTANEOUS | 1 refills | Status: DC

## 2023-12-09 NOTE — Progress Notes (Signed)
 Subjective:     Patient ID: Maria Gross, female    DOB: January 30, 1961, 63 y.o.   MRN: [id number]  Chief Complaint  Patient presents with   Medical Management of Chronic Issues    6 month f/u    HPI   History of Present Illness         She is here to follow up on chronic health conditions including diabetes, hyperlipidemia, insomnia and anxiety. She is still having issues sleeping.  Trazodone  was not helpful at all.  She stopped it.  She has been taking melatonin and Benadryl which helps somewhat.  She is taking citalopram  daily and Buspar  bid.  Reports anxiety and irritability are better controlled on these medications.  Takes Prevacid for GERD.  She has allergies  Diabetes-A1c 6.5% approximately 1 year ago.  She has been working on diet and exercise. Drinks 64 ounces of water typically  Eats a lot of protein, low sugar diet. Weight has plateaued She loves pasta but limits it.  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  Her job is demanding and takes up a lot of her time as well.  Denies history of pancreatitis.  Denies personal or family history of thyroid  cancer.  She would like to start a GLP-1.  Relpax  every other month for migraines.    Health Maintenance Due  Topic Date Due   FOOT EXAM  Never done   OPHTHALMOLOGY EXAM  Never done   Diabetic kidney evaluation - Urine ACR  Never done   Colonoscopy  12/27/2023    Past Medical History:  Diagnosis Date   Abnormal Pap smear of cervix    --age 89 had precancerous changes on pap and had hysterectomy   Alopecia    Anemia    Anxiety    Diverticulitis    Endometriosis    Family history of kidney cancer    Family history of ovarian cancer    Family history of prostate cancer    Family history of uterine cancer    High cholesterol    Migraines    some w/aura   Prediabetes    Renal stones    Uterine cancer (HCC)    dx at 56    Past  Surgical History:  Procedure Laterality Date   ABDOMINAL HYSTERECTOMY     L-TAH--ovaries remain--uterine   left kidney blockage Left    age [age], and age 33   TONSILLECTOMY AND ADENOIDECTOMY     -age 61   urethral stent Left    age 38    Family History  Problem Relation Age of Onset   Diabetes Mother 63   Other Mother        passed away from a blood clot after sinus surgery   Migraines Mother    COPD Father 75   Diabetes Father    Diabetes Sister    Cancer Maternal Grandfather 13       dec metastatic prostate ca   Leukemia Paternal Aunt    Kidney cancer Paternal Uncle        dx in his late 106s-30s   Glomerulonephritis Paternal Uncle    Cancer Cousin        uterine and ovarian   Cancer Cousin        uterine and ovarian   Hypertension Daughter     Social History   Socioeconomic History   Marital status:  Married    Spouse name: Not on file   Number of children: 1   Years of education: Not on file   Highest education level: Bachelor's degree (e.g., BA, AB, BS)  Occupational History   Not on file  Tobacco Use   Smoking status: Never   Smokeless tobacco: Never  Vaping Use   Vaping status: Never Used  Substance and Sexual Activity   Alcohol use: Not Currently    Alcohol/week: 0.0 standard drinks of alcohol   Drug use: No   Sexual activity: Not Currently    Partners: Male    Comment: Hyst  Other Topics Concern   Not on file  Social History Narrative   Daughter, 1 grand son.   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)   Overall Financial Resource Strain (CARDIA)    Difficulty of Paying Living Expenses: Not very hard  Food Insecurity: No Food Insecurity (12/09/2023)   Hunger Vital Sign    Worried About Running Out of Food in the Last Year: Never true    Ran Out of Food in the Last Year: Never true  Transportation Needs: No Transportation Needs (12/09/2023)   PRAPARE - Scientist, research (physical sciences) (Medical): No    Lack of Transportation (Non-Medical): No  Physical Activity: Insufficiently Active (12/09/2023)   Exercise Vital Sign    Days of Exercise per Week: 3 days    Minutes of Exercise per Session: 20 min  Stress: Stress Concern Present (12/09/2023)   [employer] of Occupational Health - Occupational Stress Questionnaire    Feeling of Stress : Rather much  Social Connections: Moderately Integrated (12/09/2023)   Social Connection and Isolation Panel [NHANES]    Frequency of Communication with Friends and Family: More than three times a week    Frequency of Social Gatherings with Friends and Family: Once a week    Attends Religious Services: More than 4 times per year    Active Member of [employer] or Organizations: No    Attends Engineer, structural: Not on file    Marital Status: Married  Catering manager Violence: Not on file    Outpatient Medications Prior to Visit  Medication Sig Dispense Refill   atorvastatin  (LIPITOR) 40 MG tablet TAKE 1 TABLET(40 MG) BY MOUTH AT BEDTIME 90 tablet 3   busPIRone  (BUSPAR ) 5 MG tablet TAKE 1 TABLET(5 MG) BY MOUTH TWICE DAILY 180 tablet 1   Calcium  Carb-Cholecalciferol (CALCIUM  1000 + D PO)      citalopram  (CELEXA ) 40 MG tablet TAKE 1 TABLET(40 MG) BY MOUTH AT BEDTIME 90 tablet 3   Cyanocobalamin  (VITAMIN B-12 PO) Take 1 tablet by mouth every other day. 3 times a week     eletriptan  (RELPAX ) 40 MG tablet Take 1 tablet (40 mg total) by mouth as needed for migraine or headache. May repeat in 2 hours if headache persists or recurs. 10 tablet 3   estradiol  (ESTRACE ) 0.1 MG/GM vaginal cream Use 1 g vaginally two times per week as needed to maintain symptom relief. 42.5 g 1   fluticasone (FLONASE) 50 MCG/ACT nasal spray Place into both nostrils daily.     Lansoprazole (PREVACID PO) Take 1 tablet by mouth daily.     loratadine (CLARITIN) 10 MG tablet Take 10 mg by mouth daily.     metFORMIN  (GLUCOPHAGE -XR) 500 MG 24 hr  tablet TAKE 1 TABLET(500 MG) BY MOUTH TWICE DAILY WITH  A MEAL 180 tablet 0   Multiple Vitamin (MULTIVITAMIN) capsule Take 1 capsule by mouth daily.     nystatin  powder APPLY TOPICALLY TO THE AFFECTED AREA THREE TIMES DAILY FOR UP TO 7 DAYS 30 g 3   Polyethyl Glycol-Propyl Glycol 0.4-0.3 % SOLN Place 1 drop into both eyes 2 (two) times daily.     diphenhydrAMINE (BENADRYL) 25 MG tablet Take 25 mg by mouth at bedtime as needed.     Melatonin 5 MG CAPS Take by mouth in the morning and at bedtime.     MELATONIN PO Take 10 mg by mouth at bedtime.     Hydrocortisone Acetate 1 % CREA Apply topically.     traZODone  (DESYREL ) 50 MG tablet TAKE 1 TABLET(50 MG) BY MOUTH AT BEDTIME 90 tablet 0   No facility-administered medications prior to visit.    Allergies  Allergen Reactions   Ivp Dye [Iodinated Contrast Media] Other (See Comments)    seizures    Review of Systems  Constitutional:  Negative for chills, fever and malaise/fatigue.  Respiratory:  Negative for shortness of breath.   Cardiovascular:  Negative for chest pain, palpitations and leg swelling.  Gastrointestinal:  Negative for abdominal pain, constipation, diarrhea, nausea and vomiting.  Genitourinary:  Negative for dysuria, frequency and urgency.  Musculoskeletal:  Negative for falls.  Neurological:  Positive for headaches. Negative for dizziness and focal weakness.       Occasional migraine, approximately 1 every other month.  Endo/Heme/Allergies:  Positive for environmental allergies. Negative for polydipsia.  Psychiatric/Behavioral:  Negative for depression and substance abuse. The patient is nervous/anxious and has insomnia.        Objective:     Physical Exam Constitutional:      General: She is not in acute distress.    Appearance: She is not ill-appearing.  Eyes:     Extraocular Movements: Extraocular movements intact.     Conjunctiva/sclera: Conjunctivae normal.  Cardiovascular:     Rate and Rhythm: Normal rate.   Pulmonary:     Effort: Pulmonary effort is normal.  Musculoskeletal:     Cervical back: Normal range of motion and neck supple.  Skin:    General: Skin is warm and dry.  Neurological:     General: No focal deficit present.     Mental Status: She is alert and oriented to person, place, and time.  Psychiatric:        Mood and Affect: Mood normal.        Behavior: Behavior normal.        Thought Content: Thought content normal.      BP 122/84 (BP Location: Left Arm, Patient Position: Sitting)   Pulse 90   Temp 97.6 F (36.4 C) (Temporal)   Ht 5' 2.5" (1.588 m)   Wt 166 lb (75.3 kg)   SpO2 95%   BMI 29.88 kg/m  Wt Readings from Last 3 Encounters:  12/09/23 166 lb (75.3 kg)  06/16/23 169 lb (76.7 kg)  03/16/23 175 lb (79.4 kg)       Assessment & Plan:   Problem List Items Addressed This Visit     Anxiety   Doing well on current medications. Continue Buspar  bid and Celexa  daily.  Not sleeping well and trazodone  not helpful. Start Lunesta .       Controlled type 2 diabetes mellitus without complication, without long-term current use of insulin (HCC) - Primary   Start Mounjaro. Counseling on how to take the medication and potential side effects.  Continue metformin . Counseling on healthy diet and exercise. On statin therapy.  Yearly eye exams. Check A1c and renal function.       Relevant Medications   tirzepatide (MOUNJARO) 2.5 MG/0.5ML Pen   Other Relevant Orders   CBC with Differential/Platelet (Completed)   Comprehensive metabolic panel with GFR (Completed)   Hemoglobin A1c (Completed)   Vitamin B12 (Completed)   TSH (Completed)   T4, free (Completed)   Environmental and seasonal allergies   Continue medication regimen      Gastroesophageal reflux disease without esophagitis   Continue Prevacid and avoiding triggers. Follow up with GI as warranted.       Hyperlipidemia   Continue statin and low-fat diet.  Mounjaro should also help with lowering cholesterol.        Insomnia   Start Lunesta . Stop trazodone , Benadryl and melatonin. Follow up in 8 wks.       Relevant Medications   eszopiclone  (LUNESTA ) 1 MG TABS tablet   Other Relevant Orders   TSH (Completed)   T4, free (Completed)   Migraines   Controlled        I have discontinued Sarajean S. Fritsche's MELATONIN PO, Hydrocortisone Acetate, diphenhydrAMINE, traZODone , and Melatonin. I am also having her start on tirzepatide and eszopiclone . Additionally, I am having her maintain her Cyanocobalamin  (VITAMIN B-12 PO), Polyethyl Glycol-Propyl Glycol, multivitamin, loratadine, Calcium  Carb-Cholecalciferol (CALCIUM  1000 + D PO), nystatin , Lansoprazole (PREVACID PO), fluticasone, estradiol , eletriptan , citalopram , atorvastatin , busPIRone , and metFORMIN .  Meds ordered this encounter  Medications   tirzepatide (MOUNJARO) 2.5 MG/0.5ML Pen    Sig: Inject 2.5 mg into the skin once a week.    Dispense:  2 mL    Refill:  1    Supervising Provider:   Bambi Lever A [[id number]]   eszopiclone  (LUNESTA ) 1 MG TABS tablet    Sig: Take 1 tablet (1 mg total) by mouth at bedtime as needed for sleep. Take immediately before bedtime    Dispense:  30 tablet    Refill:  1    Supervising Provider:   Bambi Lever A [[id number]]

## 2023-12-09 NOTE — Patient Instructions (Signed)
 Please go downstairs for labs before you leave.  Stop melatonin and Benadryl and try Lunesta  at bedtime for sleep.  Avoid alcohol or any other sedating medications with Lunesta .  Make sure you have at least 7 to 8 hours of sleep time before you take this.  No driving on this medication.  I sent Mounjaro to your pharmacy.  This is a once weekly injection for diabetes and weight loss.  Stay well-hydrated and eat small amounts of food.  Eat a diet low in fat. Most common side effects are nausea and bloating.  If you have issues with constipation, you can take over-the-counter MiraLAX 1 capful daily.  Follow-up in 6 to 8 weeks.

## 2023-12-10 ENCOUNTER — Ambulatory Visit

## 2023-12-12 ENCOUNTER — Other Ambulatory Visit: Payer: Self-pay | Admitting: Family Medicine

## 2023-12-12 DIAGNOSIS — G4700 Insomnia, unspecified: Secondary | ICD-10-CM

## 2023-12-13 DIAGNOSIS — E119 Type 2 diabetes mellitus without complications: Secondary | ICD-10-CM | POA: Insufficient documentation

## 2023-12-13 NOTE — Assessment & Plan Note (Signed)
 Continue statin and low-fat diet.  Mounjaro should also help with lowering cholesterol.

## 2023-12-13 NOTE — Assessment & Plan Note (Signed)
 Controlled.

## 2023-12-13 NOTE — Assessment & Plan Note (Signed)
 Doing well on current medications. Continue Buspar  bid and Celexa  daily.  Not sleeping well and trazodone  not helpful. Start Lunesta .

## 2023-12-13 NOTE — Assessment & Plan Note (Signed)
 Start Mounjaro. Counseling on how to take the medication and potential side effects. Continue metformin . Counseling on healthy diet and exercise. On statin therapy.  Yearly eye exams. Check A1c and renal function.

## 2023-12-13 NOTE — Assessment & Plan Note (Signed)
 Start Lunesta . Stop trazodone , Benadryl and melatonin. Follow up in 8 wks.

## 2023-12-13 NOTE — Assessment & Plan Note (Signed)
 Continue medication regimen

## 2023-12-13 NOTE — Assessment & Plan Note (Signed)
 Continue Prevacid and avoiding triggers. Follow up with GI as warranted.

## 2023-12-22 ENCOUNTER — Ambulatory Visit
Admission: RE | Admit: 2023-12-22 | Discharge: 2023-12-22 | Disposition: A | Discharge status: Home / Self Care | Source: Ambulatory Visit | Attending: Obstetrics and Gynecology | Admitting: Obstetrics and Gynecology

## 2023-12-22 DIAGNOSIS — Z1231 Encounter for screening mammogram for malignant neoplasm of breast: Secondary | ICD-10-CM

## 2023-12-27 ENCOUNTER — Ambulatory Visit: Payer: Self-pay | Admitting: Obstetrics and Gynecology

## 2024-02-05 ENCOUNTER — Other Ambulatory Visit: Payer: Self-pay | Admitting: Family Medicine

## 2024-02-05 DIAGNOSIS — E119 Type 2 diabetes mellitus without complications: Secondary | ICD-10-CM

## 2024-02-06 ENCOUNTER — Other Ambulatory Visit: Payer: Self-pay | Admitting: Family Medicine

## 2024-02-06 DIAGNOSIS — G4700 Insomnia, unspecified: Secondary | ICD-10-CM

## 2024-02-11 ENCOUNTER — Encounter: Payer: Self-pay | Admitting: Family Medicine

## 2024-02-11 ENCOUNTER — Ambulatory Visit: Admitting: Family Medicine

## 2024-02-11 VITALS — BP 120/74 | HR 78 | Temp 97.8°F | Ht 62.5 in | Wt 152.0 lb

## 2024-02-11 DIAGNOSIS — Z1211 Encounter for screening for malignant neoplasm of colon: Secondary | ICD-10-CM

## 2024-02-11 DIAGNOSIS — E7800 Pure hypercholesterolemia, unspecified: Secondary | ICD-10-CM

## 2024-02-11 DIAGNOSIS — E119 Type 2 diabetes mellitus without complications: Secondary | ICD-10-CM

## 2024-02-11 DIAGNOSIS — G4700 Insomnia, unspecified: Secondary | ICD-10-CM | POA: Diagnosis not present

## 2024-02-11 DIAGNOSIS — Z7985 Long-term (current) use of injectable non-insulin antidiabetic drugs: Secondary | ICD-10-CM

## 2024-02-11 NOTE — Progress Notes (Signed)
 Subjective:     Patient ID: Maria Gross, female    DOB: [birth year], 62 y.o.   MRN: [id number]  Chief Complaint  Patient presents with   Medical Management of Chronic Issues    Doing great on medication changes     HPI  History of Present Illness         Maria Gross is here to follow up on chronic health conditions. At her last visit Maria Gross started Mounjaro  2.5 mg and has been taking it for the past 8 weeks.  Maria Gross has lost 14 lbs.  Eating smaller amounts and healthier food.    Diarrhea and nausea but managing ok   Maria Gross is taking Lunesta  for insomnia and it works well.   Eye exam done - [facility] 03/2023   Due for colonoscopy     Health Maintenance Due  Topic Date Due   FOOT EXAM  Never done   OPHTHALMOLOGY EXAM  Never done   Diabetic kidney evaluation - Urine ACR  Never done   Pneumococcal Vaccine 16-86 Years old (1 of 2 - PCV) Never done   COVID-19 Vaccine (4 - 2024-25 season) 03/28/2023   Colonoscopy  12/27/2023    Past Medical History:  Diagnosis Date   Abnormal Pap smear of cervix    --age 89 had precancerous changes on pap and had hysterectomy   Alopecia    Anemia    Anxiety    Diverticulitis    Endometriosis    Family history of kidney cancer    Family history of ovarian cancer    Family history of prostate cancer    Family history of uterine cancer    High cholesterol    Migraines    some w/aura   Prediabetes    Renal stones    Uterine cancer (HCC)    dx at 3    Past Surgical History:  Procedure Laterality Date   ABDOMINAL HYSTERECTOMY     L-TAH--ovaries remain--uterine   left kidney blockage Left    age [age], and age 89   TONSILLECTOMY AND ADENOIDECTOMY     -age 32   urethral stent Left    age 7    Family History  Problem Relation Age of Onset   Diabetes Mother 69   Other Mother        passed away from a blood clot after sinus surgery   Migraines Mother    COPD Father 39   Diabetes Father    Diabetes Sister    Cancer Maternal  Grandfather 81       dec metastatic prostate ca   Leukemia Paternal Aunt    Kidney cancer Paternal Uncle        dx in his late 5s-30s   Glomerulonephritis Paternal Uncle    Cancer Cousin        uterine and ovarian   Cancer Cousin        uterine and ovarian   Hypertension Daughter     Social History   Socioeconomic History   Marital status: Married    Spouse name: Not on file   Number of children: 1   Years of education: Not on file   Highest education level: Bachelor's degree (e.g., BA, AB, BS)  Occupational History   Not on file  Tobacco Use   Smoking status: Never   Smokeless tobacco: Never  Vaping Use   Vaping status: Never Used  Substance and Sexual Activity   Alcohol use: Not Currently  Alcohol/week: 0.0 standard drinks of alcohol   Drug use: No   Sexual activity: Not Currently    Partners: Male    Comment: Hyst  Other Topics Concern   Not on file  Social History Narrative   Daughter, 1 grand son.   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)   Overall Financial Resource Strain (CARDIA)    Difficulty of Paying Living Expenses: Not very hard  Food Insecurity: No Food Insecurity (12/09/2023)   Hunger Vital Sign    Worried About Running Out of Food in the Last Year: Never true    Ran Out of Food in the Last Year: Never true  Transportation Needs: No Transportation Needs (12/09/2023)   PRAPARE - Administrator, Civil Service (Medical): No    Lack of Transportation (Non-Medical): No  Physical Activity: Insufficiently Active (12/09/2023)   Exercise Vital Sign    Days of Exercise per Week: 3 days    Minutes of Exercise per Session: 20 min  Stress: Stress Concern Present (12/09/2023)   [employer] of Occupational Health - Occupational Stress Questionnaire    Feeling of Stress : Rather much  Social Connections: Moderately Integrated (12/09/2023)   Social Connection and Isolation Panel     Frequency of Communication with Friends and Family: More than three times a week    Frequency of Social Gatherings with Friends and Family: Once a week    Attends Religious Services: More than 4 times per year    Active Member of [employer] or Organizations: No    Attends Engineer, structural: Not on file    Marital Status: Married  Catering manager Violence: Not on file    Outpatient Medications Prior to Visit  Medication Sig Dispense Refill   atorvastatin  (LIPITOR) 40 MG tablet TAKE 1 TABLET(40 MG) BY MOUTH AT BEDTIME 90 tablet 3   busPIRone  (BUSPAR ) 5 MG tablet TAKE 1 TABLET(5 MG) BY MOUTH TWICE DAILY 180 tablet 1   Calcium  Carb-Cholecalciferol (CALCIUM  1000 + D PO)      citalopram  (CELEXA ) 40 MG tablet TAKE 1 TABLET(40 MG) BY MOUTH AT BEDTIME 90 tablet 3   Cyanocobalamin  (VITAMIN B-12 PO) Take 1 tablet by mouth every other day. 3 times a week     eletriptan  (RELPAX ) 40 MG tablet Take 1 tablet (40 mg total) by mouth as needed for migraine or headache. May repeat in 2 hours if headache persists or recurs. 10 tablet 3   estradiol  (ESTRACE ) 0.1 MG/GM vaginal cream Use 1 g vaginally two times per week as needed to maintain symptom relief. 42.5 g 1   eszopiclone  (LUNESTA ) 1 MG TABS tablet TAKE 1 TABLET(1 MG) BY MOUTH AT BEDTIME AS NEEDED FOR SLEEP. TAKE IMMEDIATELY BEFORE BEDTIME. 30 tablet 1   fluticasone (FLONASE) 50 MCG/ACT nasal spray Place into both nostrils daily.     Lansoprazole (PREVACID PO) Take 1 tablet by mouth daily.     loratadine (CLARITIN) 10 MG tablet Take 10 mg by mouth daily.     metFORMIN  (GLUCOPHAGE -XR) 500 MG 24 hr tablet TAKE 1 TABLET(500 MG) BY MOUTH TWICE DAILY WITH A MEAL 180 tablet 0   Multiple Vitamin (MULTIVITAMIN) capsule Take 1 capsule by mouth daily.     nystatin  powder APPLY TOPICALLY TO THE AFFECTED AREA THREE TIMES DAILY FOR UP TO 7 DAYS 30 g 3   Polyethyl Glycol-Propyl Glycol 0.4-0.3 % SOLN Place 1 drop into both  eyes 2 (two) times daily.      tirzepatide  (MOUNJARO ) 2.5 MG/0.5ML Pen ADMINISTER 2.5 MG UNDER THE SKIN 1 TIME A WEEK 6 mL 0   No facility-administered medications prior to visit.    Allergies  Allergen Reactions   Ivp Dye [Iodinated Contrast Media] Other (See Comments)    seizures    Review of Systems  Constitutional:  Positive for weight loss. Negative for chills, fever and malaise/fatigue.       Intentional   Eyes:  Negative for blurred vision and double vision.  Respiratory:  Negative for cough and shortness of breath.   Cardiovascular:  Negative for chest pain, palpitations and leg swelling.  Gastrointestinal:  Positive for diarrhea and nausea. Negative for abdominal pain, constipation and vomiting.  Genitourinary:  Negative for dysuria, frequency and urgency.  Musculoskeletal:  Negative for falls.  Neurological:  Negative for dizziness, focal weakness and headaches.  Psychiatric/Behavioral:  Negative for depression. The patient is not nervous/anxious.        Objective:    Physical Exam Constitutional:      General: Maria Gross is not in acute distress.    Appearance: Maria Gross is not ill-appearing.  HENT:     Mouth/Throat:     Mouth: Mucous membranes are moist.     Pharynx: Oropharynx is clear.  Eyes:     Extraocular Movements: Extraocular movements intact.     Conjunctiva/sclera: Conjunctivae normal.  Cardiovascular:     Rate and Rhythm: Normal rate.  Pulmonary:     Effort: Pulmonary effort is normal.  Musculoskeletal:     Cervical back: Normal range of motion and neck supple.  Skin:    General: Skin is warm and dry.  Neurological:     General: No focal deficit present.     Mental Status: Maria Gross is alert and oriented to person, place, and time.     Cranial Nerves: No cranial nerve deficit.     Motor: No weakness.     Coordination: Coordination normal.     Gait: Gait normal.  Psychiatric:        Mood and Affect: Mood normal.        Behavior: Behavior normal.        Thought Content: Thought content  normal.      BP 120/74 (BP Location: Left Arm, Patient Position: Sitting)   Pulse 78   Temp 97.8 F (36.6 C) (Temporal)   Ht 5' 2.5 (1.588 m)   Wt 152 lb (68.9 kg)   SpO2 98%   BMI 27.36 kg/m  Wt Readings from Last 3 Encounters:  02/11/24 152 lb (68.9 kg)  12/09/23 166 lb (75.3 kg)  06/16/23 169 lb (76.7 kg)       Assessment & Plan:   Problem List Items Addressed This Visit     Controlled type 2 diabetes mellitus without complication, without long-term current use of insulin (HCC) - Primary   Hyperlipidemia   Insomnia   Continue Mounjaro  2.5 mg weekly.  Get annual eye exam when due.  Continue statin therapy.  Reviewed recent labs which were good 2 months ago.  Continue Lunesta  for insomnia.  Follow up here in 4 months fasting.    I am having Jaeleigh Monaco. Mestas maintain her Cyanocobalamin  (VITAMIN B-12 PO), Polyethyl Glycol-Propyl Glycol, multivitamin, loratadine, Calcium  Carb-Cholecalciferol (CALCIUM  1000 + D PO), nystatin , Lansoprazole (PREVACID PO), fluticasone, estradiol , eletriptan , citalopram , atorvastatin , busPIRone , metFORMIN , Mounjaro , and eszopiclone .  No orders of the defined types were placed in this encounter.

## 2024-03-03 ENCOUNTER — Encounter: Payer: Self-pay | Admitting: Family Medicine

## 2024-03-04 ENCOUNTER — Other Ambulatory Visit: Payer: Self-pay | Admitting: Family Medicine

## 2024-03-06 ENCOUNTER — Ambulatory Visit: Payer: Self-pay | Admitting: Internal Medicine

## 2024-03-06 ENCOUNTER — Ambulatory Visit: Admitting: Internal Medicine

## 2024-03-06 ENCOUNTER — Ambulatory Visit: Payer: Self-pay

## 2024-03-06 ENCOUNTER — Encounter: Payer: Self-pay | Admitting: Internal Medicine

## 2024-03-06 VITALS — BP 128/78 | HR 78 | Temp 97.9°F | Ht 62.5 in

## 2024-03-06 DIAGNOSIS — F419 Anxiety disorder, unspecified: Secondary | ICD-10-CM | POA: Diagnosis not present

## 2024-03-06 DIAGNOSIS — E119 Type 2 diabetes mellitus without complications: Secondary | ICD-10-CM

## 2024-03-06 DIAGNOSIS — R109 Unspecified abdominal pain: Secondary | ICD-10-CM | POA: Insufficient documentation

## 2024-03-06 DIAGNOSIS — Z7984 Long term (current) use of oral hypoglycemic drugs: Secondary | ICD-10-CM

## 2024-03-06 DIAGNOSIS — Z7985 Long-term (current) use of injectable non-insulin antidiabetic drugs: Secondary | ICD-10-CM

## 2024-03-06 LAB — URINALYSIS, ROUTINE W REFLEX MICROSCOPIC
Bilirubin Urine: NEGATIVE
Hgb urine dipstick: NEGATIVE
Nitrite: NEGATIVE
Specific Gravity, Urine: <=1.005 — AB (ref 1.000–1.030)
Total Protein, Urine: NEGATIVE
Urine Glucose: NEGATIVE
Urobilinogen, UA: 0.2 (ref 0.0–1.0)
pH: 6 (ref 5.0–8.0)

## 2024-03-06 MED ORDER — OZEMPIC (0.25 OR 0.5 MG/DOSE) 2 MG/3ML ~~LOC~~ SOPN
PEN_INJECTOR | SUBCUTANEOUS | 5 refills | Status: DC
Start: 2024-03-06 — End: 2024-04-04

## 2024-03-06 NOTE — Assessment & Plan Note (Signed)
 Lab Results  Component Value Date   HGBA1C 6.2 12/09/2023   Unfortunately not able to tolerate the mounjaro , pt to change to ozemipc 0.25 mg weekly as may be better tolerated, cont metformin  ER 500 mg - 1 bid

## 2024-03-06 NOTE — Telephone Encounter (Signed)
 FYI Only or Action Required?: FYI only for provider.  Patient was last seen in primary care on 02/11/2024 by Lendia Boby CROME, NP-C.  Called Nurse Triage reporting Medication Problem, Diarrhea, and Nausea.  Symptoms began several weeks ago.  Interventions attempted: OTC medications: ibuprofen, Rest, hydration, or home remedies, and Ice/heat application.  Symptoms are: nausea, decreased oral intake, left flank pain, decreased urinary output, darker colored urine, urinary urgency, diarrhea (2 episodes in last 24 hours) stable.  Triage Disposition: See Today in Office (overriding Call PCP Within 24 Hours)  Patient/caregiver understands and will follow disposition?: Yes        Copied from CRM #[id number]. Topic: Clinical - Red Word Triage >> Mar 06, 2024  8:05 AM Mesmerise C wrote: [facility] that prompted transfer to Nurse Triage: Patient stated she's having issues urinating due to taking her medication for Mounjaro  states that she's not been able to get her daily water intake and because of that causes her to not be able to urinate, patient stated it also makes her sick not being able to eat and has diarrhea, states yesterday she only had 8oz of water worried about her kidney function due to lack of water Reason for Disposition  Taking prescription medication that could cause nausea (e.g., narcotics/opiates, antibiotics, OCPs, many others)  Answer Assessment - Initial Assessment Questions Patient states she held her Mounjaro  dose this weekend. She states she is on the medication for blood sugar control/diabetes. She states she has not checked her blood sugar, and states it does not feel low.  1. NAUSEA SEVERITY: How bad is the nausea? (e.g., mild, moderate, severe; dehydration, weight loss)     She states the Mounjaro  makes her nauseated to the point she can't drink water and doesn't want to eat. She states she is very concerned about her kidney function. Patient states yesterday she was  able to get 64 oz of water in yesterday.  2. ONSET: When did the nausea begin?     May or June, since started Mounjaro . She states the symptoms have not subsided or decreased at all for her.  3. VOMITING: Any vomiting? If Yes, ask: How many times today?     No.  4. RECURRENT SYMPTOM: Have you had nausea before? If Yes, ask: When was the last time? What happened that time?     No.  5. CAUSE: What do you think is causing the nausea?     Mounjaro .  6. PREGNANCY: Is there any chance you are pregnant? (e.g., unprotected intercourse, missed birth control pill, broken condom)     N/A.  Patient states she is also having  (2 episodes in the last 24 hours) diarrhea, 3/10 left flank pain (treated with ibuprofen and heating pad), urinary urgency, decreased urinary output (more than a few drops, she states her baseline was going every hour with a steady flow and last week she states she was urinating 3 times daily). Patient states she has urinated twice this morning (first time was dark and second time was lighter). Patient denies fever, blood in urine. Patient states her flank pain and urinary symptoms have only been for 1 week.  Protocols used: Nausea-A-AH

## 2024-03-06 NOTE — Assessment & Plan Note (Signed)
 With some situational worsening due to nausea, declines anti emetic but hoping for less with better tolerated ozempic 

## 2024-03-06 NOTE — Progress Notes (Signed)
 Patient ID: Maria Gross, female   DOB: 02-18-1961, 63 y.o.   MRN: [id number]        Chief Complaint: follow up left flank pain, dm, anxiety       HPI:  Maria Gross is a 63 y.o. female here with c/o 2 days onset left flank pain, sharp but also with reduced urinary volume and frequency, concerned about uti, denies however Denies urinary symptoms such as dysuria, frequency, urgency, hematuria or n/v, fever, chills.  Pt denies chest pain, increased sob or doe, wheezing, orthopnea, PND, increased LE swelling, palpitations, dizziness or syncope.   Pt denies polydipsia, polyuria, or new focal neuro s/s.   Also has been taking mounjaro  2.5 mg weekly for almost 2 mo but the nausea just simply has not improved, asks for change. Ibuprofen did help flank pain.        Wt Readings from Last 3 Encounters:  02/11/24 152 lb (68.9 kg)  12/09/23 166 lb (75.3 kg)  06/16/23 169 lb (76.7 kg)   BP Readings from Last 3 Encounters:  03/06/24 128/78  02/11/24 120/74  12/09/23 122/84         Past Medical History:  Diagnosis Date   Abnormal Pap smear of cervix    --age 49 had precancerous changes on pap and had hysterectomy   Allergy 1965   pollen, grass, trees   Alopecia    Anemia    Anxiety    Diverticulitis    Endometriosis    Family history of kidney cancer    Family history of ovarian cancer    Family history of prostate cancer    Family history of uterine cancer    High cholesterol    Migraines    some w/aura   Prediabetes    Renal stones    Uterine cancer (HCC)    dx at 42   Past Surgical History:  Procedure Laterality Date   ABDOMINAL HYSTERECTOMY     L-TAH--ovaries remain--uterine   EYE SURGERY  1996   Lasic   left kidney blockage Left    age [age], and age 66   TONSILLECTOMY AND ADENOIDECTOMY     -age 5   urethral stent Left    age 48    reports that she has never smoked. She has never used smokeless tobacco. She reports that she does not currently use alcohol. She reports that  she does not use drugs. family history includes COPD (age of onset: 25) in her father; Cancer in her cousin and cousin; Cancer (age of onset: 93) in her maternal grandfather; Diabetes in her father and sister; Diabetes (age of onset: 8) in her mother; Early death in her mother; Glomerulonephritis in her paternal uncle; Hypertension in her daughter; Kidney cancer in her paternal uncle; Kidney disease in her maternal uncle; Leukemia in her paternal aunt; Migraines in her mother; Other in her mother; Varicose Veins in her mother. Allergies  Allergen Reactions   Ivp Dye [Iodinated Contrast Media] Other (See Comments)    seizures   Current Outpatient Medications on File Prior to Visit  Medication Sig Dispense Refill   atorvastatin  (LIPITOR) 40 MG tablet TAKE 1 TABLET(40 MG) BY MOUTH AT BEDTIME 90 tablet 3   busPIRone  (BUSPAR ) 5 MG tablet TAKE 1 TABLET(5 MG) BY MOUTH TWICE DAILY 180 tablet 1   Calcium  Carb-Cholecalciferol (CALCIUM  1000 + D PO)      citalopram  (CELEXA ) 40 MG tablet TAKE 1 TABLET(40 MG) BY MOUTH AT BEDTIME 90 tablet 3  Cyanocobalamin  (VITAMIN B-12 PO) Take 1 tablet by mouth every other day. 3 times a week     eletriptan  (RELPAX ) 40 MG tablet Take 1 tablet (40 mg total) by mouth as needed for migraine or headache. May repeat in 2 hours if headache persists or recurs. 10 tablet 3   estradiol  (ESTRACE ) 0.1 MG/GM vaginal cream Use 1 g vaginally two times per week as needed to maintain symptom relief. 42.5 g 1   eszopiclone  (LUNESTA ) 1 MG TABS tablet TAKE 1 TABLET(1 MG) BY MOUTH AT BEDTIME AS NEEDED FOR SLEEP. TAKE IMMEDIATELY BEFORE BEDTIME. 30 tablet 1   fluticasone (FLONASE) 50 MCG/ACT nasal spray Place into both nostrils daily.     Lansoprazole (PREVACID PO) Take 1 tablet by mouth daily.     loratadine (CLARITIN) 10 MG tablet Take 10 mg by mouth daily.     metFORMIN  (GLUCOPHAGE -XR) 500 MG 24 hr tablet TAKE 1 TABLET(500 MG) BY MOUTH TWICE DAILY WITH A MEAL 180 tablet 0   Multiple  Vitamin (MULTIVITAMIN) capsule Take 1 capsule by mouth daily.     nystatin  powder APPLY TOPICALLY TO THE AFFECTED AREA THREE TIMES DAILY FOR UP TO 7 DAYS 30 g 3   Polyethyl Glycol-Propyl Glycol 0.4-0.3 % SOLN Place 1 drop into both eyes 2 (two) times daily.     No current facility-administered medications on file prior to visit.        ROS:  All others reviewed and negative.  Objective        PE:  BP 128/78   Pulse 78   Temp 97.9 F (36.6 C)   Ht 5' 2.5 (1.588 m)   SpO2 99%   BMI 27.36 kg/m                 Constitutional: Pt appears in NAD               HENT: Head: NCAT.                Right Ear: External ear normal.                 Left Ear: External ear normal.                Eyes: . Pupils are equal, round, and reactive to light. Conjunctivae and EOM are normal               Nose: without d/c or deformity               Neck: Neck supple. Gross normal ROM               Cardiovascular: Normal rate and regular rhythm.                 Pulmonary/Chest: Effort normal and breath sounds without rales or wheezing.                Abd:  Soft, NT, ND, + BS, no organomegaly               Neurological: Pt is alert. At baseline orientation, motor grossly intact               Skin: Skin is warm. No rashes, no other new lesions, LE edema - none               Psychiatric: Pt behavior is normal without agitation , mod nervous  Micro: none  Cardiac tracings I have personally interpreted today:  none  Pertinent  Radiological findings (summarize): none   Lab Results  Component Value Date   WBC 6.8 12/09/2023   HGB 13.5 12/09/2023   HCT 40.5 12/09/2023   PLT 247.0 12/09/2023   GLUCOSE 101 (H) 12/09/2023   CHOL 157 06/16/2023   TRIG 123.0 06/16/2023   HDL 55.20 06/16/2023   LDLCALC 77 06/16/2023   ALT 17 12/09/2023   AST 19 12/09/2023   NA 139 12/09/2023   K 3.6 12/09/2023   CL 103 12/09/2023   CREATININE 0.87 12/09/2023   BUN 15 12/09/2023   CO2 27 12/09/2023   TSH 0.80  12/09/2023   HGBA1C 6.2 12/09/2023   Assessment/Plan:  Maria Gross is a 63 y.o. White or Caucasian [1] female with  has a past medical history of Abnormal Pap smear of cervix, Allergy (1965), Alopecia, Anemia, Anxiety, Diverticulitis, Endometriosis, Family history of kidney cancer, Family history of ovarian cancer, Family history of prostate cancer, Family history of uterine cancer, High cholesterol, Migraines, Prediabetes, Renal stones, and Uterine cancer (HCC).  Left flank pain Etiology unclear  ? Msk, but can't r/o GU infection - for ua and dx  Controlled type 2 diabetes mellitus without complication, without long-term current use of insulin (HCC) Lab Results  Component Value Date   HGBA1C 6.2 12/09/2023   Unfortunately not able to tolerate the mounjaro , pt to change to ozemipc 0.25 mg weekly as may be better tolerated, cont metformin  ER 500 mg - 1 bid   Anxiety With some situational worsening due to nausea, declines anti emetic but hoping for less with better tolerated ozempic   Followup: Return if symptoms worsen or fail to improve.  Lynwood Rush, MD 03/06/2024 8:22 PM [facility] - [facility]

## 2024-03-06 NOTE — Assessment & Plan Note (Signed)
 Etiology unclear  ? Msk, but can't r/o GU infection - for ua and dx

## 2024-03-06 NOTE — Patient Instructions (Signed)
 Ok to change the mounjaro  to ozempic  0.25 mg once weekly  Please continue all other medications as before, and refills have been done if requested.  Please have the pharmacy call with any other refills you may need..  Please keep your appointments with your specialists as you may have planned  Please go to the LAB at the blood drawing area for the tests to be done - just the urine testing today  You will be contacted by phone if any changes need to be made immediately.  Otherwise, you will receive a letter about your results with an explanation, but please check with MyChart first.

## 2024-03-07 LAB — URINE CULTURE: Result:: NO GROWTH

## 2024-03-07 MED ORDER — ONDANSETRON 4 MG PO TBDP: 4.0000 mg | ORAL_TABLET | Freq: Three times a day (TID) | ORAL | 1 refills | Status: AC | PRN

## 2024-03-07 NOTE — Telephone Encounter (Signed)
 Pt is not doing any better from visit and is requesting abx and something for nausea

## 2024-03-07 NOTE — Addendum Note (Signed)
 Addended by: NORLEEN LYNWOOD ORN on: 03/07/2024 09:37 AM   Modules accepted: Orders

## 2024-03-15 ENCOUNTER — Encounter: Payer: Self-pay | Admitting: Family Medicine

## 2024-03-15 DIAGNOSIS — Z8051 Family history of malignant neoplasm of kidney: Secondary | ICD-10-CM

## 2024-03-15 DIAGNOSIS — E1122 Type 2 diabetes mellitus with diabetic chronic kidney disease: Secondary | ICD-10-CM

## 2024-03-15 DIAGNOSIS — E119 Type 2 diabetes mellitus without complications: Secondary | ICD-10-CM

## 2024-03-16 NOTE — Addendum Note (Signed)
 Addended by: Kylan Veach E on: 03/16/2024 11:41 AM   Modules accepted: Orders

## 2024-03-16 NOTE — Telephone Encounter (Signed)
 Ok for referral?

## 2024-04-01 ENCOUNTER — Encounter: Payer: Self-pay | Admitting: Family Medicine

## 2024-04-02 ENCOUNTER — Other Ambulatory Visit: Payer: Self-pay | Admitting: Family Medicine

## 2024-04-02 DIAGNOSIS — G4700 Insomnia, unspecified: Secondary | ICD-10-CM

## 2024-04-03 ENCOUNTER — Other Ambulatory Visit: Payer: Self-pay | Admitting: Family Medicine

## 2024-04-03 NOTE — Telephone Encounter (Signed)
 LOV: 03/06/24 Last fill: 02/07/24, 30 tablet 1 refill

## 2024-04-03 NOTE — Telephone Encounter (Signed)
 Wants to increase ozempic  dosage?

## 2024-04-04 MED ORDER — OZEMPIC (0.25 OR 0.5 MG/DOSE) 2 MG/3ML ~~LOC~~ SOPN: 0.5000 mg | PEN_INJECTOR | SUBCUTANEOUS | 1 refills | Status: DC

## 2024-04-04 NOTE — Addendum Note (Signed)
 Addended by: Karliah Kowalchuk E on: 04/04/2024 11:09 AM   Modules accepted: Orders

## 2024-04-12 ENCOUNTER — Ambulatory Visit: Admitting: Obstetrics and Gynecology

## 2024-05-01 ENCOUNTER — Other Ambulatory Visit (HOSPITAL_COMMUNITY): Payer: Self-pay

## 2024-05-01 ENCOUNTER — Telehealth: Payer: Self-pay

## 2024-05-01 NOTE — Telephone Encounter (Signed)
PA request has been Submitted.

## 2024-05-01 NOTE — Telephone Encounter (Signed)
 Copied from CRM #[id number]. Topic: Clinical - Medication Prior Auth >> May 01, 2024 12:46 PM Franky GRADE wrote: Reason for CRM: Patient is calling because she was informed by the pharmacy that the Semaglutide ,0.25 or 0.5MG /DOS, (OZEMPIC , 0.25 OR 0.5 MG/DOSE,) 2 MG/3ML SOPN [[id number]] requires a prior auth.

## 2024-05-02 ENCOUNTER — Telehealth: Payer: Self-pay

## 2024-05-02 ENCOUNTER — Other Ambulatory Visit (HOSPITAL_COMMUNITY): Payer: Self-pay

## 2024-05-02 NOTE — Telephone Encounter (Signed)
 Pharmacy Patient Advocate Encounter  Received notification from [employer] that Prior Authorization for Ozempic  (0.25 or 0.5 MG/DOSE) 2MG /3ML pen-injectors has been APPROVED from 05/01/2024 to 05/01/2025. Ran test claim, Copay is $24.99. This test claim was processed through [facility]- copay amounts may vary at other pharmacies due to pharmacy/plan contracts, or as the patient moves through the different stages of their insurance plan.   PA #/Case ID/Reference #: [id number]

## 2024-05-02 NOTE — Telephone Encounter (Signed)
LM for pt informing of approval

## 2024-05-06 ENCOUNTER — Encounter: Payer: Self-pay | Admitting: Family Medicine

## 2024-05-08 MED ORDER — OZEMPIC (0.25 OR 0.5 MG/DOSE) 2 MG/3ML ~~LOC~~ SOPN: 0.5000 mg | PEN_INJECTOR | SUBCUTANEOUS | 0 refills | Status: DC

## 2024-05-15 ENCOUNTER — Other Ambulatory Visit: Payer: Self-pay | Admitting: Family Medicine

## 2024-05-18 ENCOUNTER — Ambulatory Visit: Payer: Self-pay | Admitting: Family Medicine

## 2024-05-18 ENCOUNTER — Encounter: Payer: Self-pay | Admitting: Family Medicine

## 2024-05-18 ENCOUNTER — Ambulatory Visit: Admitting: Family Medicine

## 2024-05-18 VITALS — BP 122/74 | HR 82 | Temp 97.6°F | Ht 62.0 in | Wt 137.0 lb

## 2024-05-18 DIAGNOSIS — E119 Type 2 diabetes mellitus without complications: Secondary | ICD-10-CM | POA: Diagnosis not present

## 2024-05-18 DIAGNOSIS — E538 Deficiency of other specified B group vitamins: Secondary | ICD-10-CM | POA: Diagnosis not present

## 2024-05-18 DIAGNOSIS — E7800 Pure hypercholesterolemia, unspecified: Secondary | ICD-10-CM | POA: Diagnosis not present

## 2024-05-18 DIAGNOSIS — Z23 Encounter for immunization: Secondary | ICD-10-CM | POA: Diagnosis not present

## 2024-05-18 DIAGNOSIS — Z0000 Encounter for general adult medical examination without abnormal findings: Secondary | ICD-10-CM

## 2024-05-18 DIAGNOSIS — G4700 Insomnia, unspecified: Secondary | ICD-10-CM

## 2024-05-18 DIAGNOSIS — Z1211 Encounter for screening for malignant neoplasm of colon: Secondary | ICD-10-CM

## 2024-05-18 DIAGNOSIS — Z0001 Encounter for general adult medical examination with abnormal findings: Secondary | ICD-10-CM

## 2024-05-18 DIAGNOSIS — E559 Vitamin D deficiency, unspecified: Secondary | ICD-10-CM

## 2024-05-18 LAB — COMPREHENSIVE METABOLIC PANEL WITH GFR
ALT: 22 U/L (ref 0–35)
AST: 19 U/L (ref 0–37)
Albumin: 4.4 g/dL (ref 3.5–5.2)
Alkaline Phosphatase: 49 U/L (ref 39–117)
BUN: 6 mg/dL (ref 6–23)
CO2: 28 meq/L (ref 19–32)
Calcium: 9 mg/dL (ref 8.4–10.5)
Chloride: 101 meq/L (ref 96–112)
Creatinine, Ser: 0.58 mg/dL (ref 0.40–1.20)
GFR: 96.86 mL/min (ref >60.00)
Glucose, Bld: 85 mg/dL (ref 70–99)
Potassium: 3.5 meq/L (ref 3.5–5.1)
Sodium: 139 meq/L (ref 135–145)
Total Bilirubin: 1.1 mg/dL (ref 0.2–1.2)
Total Protein: 6.9 g/dL (ref 6.0–8.3)

## 2024-05-18 LAB — LIPID PANEL
Cholesterol: 113 mg/dL (ref 0–200)
HDL: 54.3 mg/dL (ref >39.00)
LDL Cholesterol: 45 mg/dL (ref 0–99)
NonHDL: 59.03
Total CHOL/HDL Ratio: 2
Triglycerides: 70 mg/dL (ref 0.0–149.0)
VLDL: 14 mg/dL (ref 0.0–40.0)

## 2024-05-18 LAB — CBC WITH DIFFERENTIAL/PLATELET
Basophils Absolute: 0 K/uL (ref 0.0–0.1)
Basophils Relative: 0.4 % (ref 0.0–3.0)
Eosinophils Absolute: 0.1 K/uL (ref 0.0–0.7)
Eosinophils Relative: 2.2 % (ref 0.0–5.0)
HCT: 37.8 % (ref 36.0–46.0)
Hemoglobin: 12.6 g/dL (ref 12.0–15.0)
Lymphocytes Relative: 38.5 % (ref 12.0–46.0)
Lymphs Abs: 2.2 K/uL (ref 0.7–4.0)
MCHC: 33.4 g/dL (ref 30.0–36.0)
MCV: 90.7 fl (ref 78.0–100.0)
Monocytes Absolute: 0.4 K/uL (ref 0.1–1.0)
Monocytes Relative: 7.3 % (ref 3.0–12.0)
Neutro Abs: 3 K/uL (ref 1.4–7.7)
Neutrophils Relative %: 51.6 % (ref 43.0–77.0)
Platelets: 238 K/uL (ref 150.0–400.0)
RBC: 4.17 Mil/uL (ref 3.87–5.11)
RDW: 13.5 % (ref 11.5–15.5)
WBC: 5.7 K/uL (ref 4.0–10.5)

## 2024-05-18 LAB — MICROALBUMIN / CREATININE URINE RATIO
Creatinine,U: 19.7 mg/dL
Microalb Creat Ratio: UNDETERMINED mg/g (ref 0.0–30.0)
Microalb, Ur: <0.7 mg/dL

## 2024-05-18 LAB — VITAMIN D 25 HYDROXY (VIT D DEFICIENCY, FRACTURES): VITD: 51.23 ng/mL (ref 30.00–100.00)

## 2024-05-18 LAB — HEMOGLOBIN A1C: Hgb A1c MFr Bld: 5.8 % (ref 4.6–6.5)

## 2024-05-18 LAB — TSH: TSH: 0.87 u[IU]/mL (ref 0.35–5.50)

## 2024-05-18 LAB — VITAMIN B12: Vitamin B-12: 703 pg/mL (ref 211–911)

## 2024-05-18 NOTE — Progress Notes (Signed)
 Complete physical exam  Patient: Maria Gross   DOB: 1960-12-07   63 y.o. Female  MRN: [id number]  Subjective:    Chief Complaint  Patient presents with   Medical Management of Chronic Issues    3 month f/u, backed her ozempic  last week back down to .25 as the 0.5 was a little too strong. Wants to talk about where she needs to be in BMI   She is here for a complete physical exam.   Discussed the use of AI scribe software for clinical note transcription with the patient, who gave verbal consent to proceed.  History of Present Illness Maria Gross is a 63 year old female with diabetes who presents for follow-up on chronic health conditions.  Glycemic control and weight management - Diabetes managed effectively with significant weight loss - BMI reduced from 33 to 25 - Last hemoglobin A1c was 6.2% in May - Anticipates further improvement in glycemic control - No issues with foot sensation, including numbness or tingling  Lipid management - Takes cholesterol-lowering medication - Last recorded LDL in the 70s  Vitamin b12 supplementation - Takes B12 supplementation due to previously low levels  Headache and sinus symptoms - Recent headaches attributed to sinus pressure and allergies - Symptoms have improved with cooler weather - No dizziness, chest pain, palpitations, or shortness of breath  Preventive care and lifestyle - Maintains hydration and monitors sodium intake - Focuses more on physical activity than dietary changes - Eye exam scheduled for November 13th - Dentist appointment scheduled for next month    Health Maintenance  Topic Date Due   Colon Cancer Screening  12/27/2023   Eye exam for diabetics  05/03/2024   Pap with HPV screening  07/11/2024   COVID-19 Vaccine (4 - 2025-26 season) 06/03/2024*   Hemoglobin A1C  11/16/2024   DTaP/Tdap/Td vaccine (2 - Td or Tdap) 01/31/2025   Yearly kidney function blood test for diabetes  05/18/2025   Yearly kidney  health urinalysis for diabetes  05/18/2025   Pneumococcal Vaccine for age over 70 (2 of 2 - PCV) 05/18/2025   Complete foot exam   05/18/2025   Breast Cancer Screening  12/21/2025   Flu Shot  Completed   Hepatitis C Screening  Completed   Zoster (Shingles) Vaccine  Completed   Hepatitis B Vaccine  Aged Out   HPV Vaccine  Aged Out   Meningitis B Vaccine  Aged Out   HIV Screening  Discontinued  *Topic was postponed. The date shown is not the original due date.     Depression screening:    05/18/2024    8:29 AM 12/09/2023    3:08 PM 06/16/2023    7:57 AM  Depression screen PHQ 2/9  Decreased Interest 0 0 0  Down, Depressed, Hopeless 0 1 0  PHQ - 2 Score 0 1 0  Altered sleeping  3   Tired, decreased energy  1   Change in appetite  1   Feeling bad or failure about yourself   0   Trouble concentrating  0   Moving slowly or fidgety/restless  0   Suicidal thoughts  0   PHQ-9 Score  6    Anxiety Screening:    10/06/2022    8:16 AM  GAD 7 : Generalized Anxiety Score  Nervous, Anxious, on Edge 1  Control/stop worrying 1  Worry too much - different things 1  Trouble relaxing 1  Restless 2  Easily annoyed or irritable 1  Afraid - awful might happen 0  Total GAD 7 Score 7  Anxiety Difficulty Somewhat difficult     Patient Care Team: Lendia Boby CROME, NP-C as PCP - General (Family Medicine) Cathlyn JAYSON Nikki Bobie FORBES, MD as Consulting Physician (Obstetrics and Gynecology)   Outpatient Medications Prior to Visit  Medication Sig   atorvastatin  (LIPITOR) 40 MG tablet TAKE 1 TABLET(40 MG) BY MOUTH AT BEDTIME   busPIRone  (BUSPAR ) 5 MG tablet TAKE 1 TABLET(5 MG) BY MOUTH TWICE DAILY   Calcium  Carb-Cholecalciferol (CALCIUM  1000 + D PO)    citalopram  (CELEXA ) 40 MG tablet TAKE 1 TABLET(40 MG) BY MOUTH AT BEDTIME   Cyanocobalamin  (VITAMIN B-12 PO) Take 1 tablet by mouth every other day. 3 times a week   eletriptan  (RELPAX ) 40 MG tablet Take 1 tablet (40 mg total) by mouth as needed  for migraine or headache. May repeat in 2 hours if headache persists or recurs.   estradiol  (ESTRACE ) 0.1 MG/GM vaginal cream Use 1 g vaginally two times per week as needed to maintain symptom relief.   eszopiclone  (LUNESTA ) 1 MG TABS tablet TAKE 1 TABLET(1 MG) BY MOUTH AT BEDTIME AS NEEDED FOR SLEEP. TAKE IMMEDIATELY BEFORE BEDTIME   fluticasone (FLONASE) 50 MCG/ACT nasal spray Place into both nostrils daily.   Lansoprazole (PREVACID PO) Take 1 tablet by mouth daily.   loratadine (CLARITIN) 10 MG tablet Take 10 mg by mouth daily.   metFORMIN  (GLUCOPHAGE -XR) 500 MG 24 hr tablet TAKE 1 TABLET(500 MG) BY MOUTH TWICE DAILY WITH A MEAL   Multiple Vitamin (MULTIVITAMIN) capsule Take 1 capsule by mouth daily.   nystatin  powder APPLY TOPICALLY TO THE AFFECTED AREA THREE TIMES DAILY FOR UP TO 7 DAYS   ondansetron  (ZOFRAN -ODT) 4 MG disintegrating tablet Take 1 tablet (4 mg total) by mouth every 8 (eight) hours as needed.   Polyethyl Glycol-Propyl Glycol 0.4-0.3 % SOLN Place 1 drop into both eyes 2 (two) times daily.   Semaglutide ,0.25 or 0.5MG /DOS, (OZEMPIC , 0.25 OR 0.5 MG/DOSE,) 2 MG/3ML SOPN Inject 0.5 mg into the skin once a week.   No facility-administered medications prior to visit.    Review of Systems  Constitutional:  Positive for weight loss. Negative for chills, fever and malaise/fatigue.  HENT:  Positive for congestion. Negative for ear pain, sinus pain and sore throat.   Eyes:  Negative for blurred vision, double vision and pain.  Respiratory:  Negative for cough, shortness of breath and wheezing.   Cardiovascular:  Negative for chest pain, palpitations and leg swelling.  Gastrointestinal:  Negative for abdominal pain, constipation, diarrhea, nausea and vomiting.  Genitourinary:  Negative for dysuria, frequency and urgency.  Musculoskeletal:  Negative for back pain, joint pain and myalgias.  Skin:  Negative for rash.  Neurological:  Negative for dizziness, tingling, focal weakness and  headaches.  Psychiatric/Behavioral:  Negative for depression, memory loss and suicidal ideas. The patient is not nervous/anxious.        Objective:    BP 122/74   Pulse 82   Temp 97.6 F (36.4 C) (Temporal)   Ht 5' 2 (1.575 m)   Wt 137 lb (62.1 kg)   SpO2 98%   BMI 25.06 kg/m  BP Readings from Last 3 Encounters:  05/18/24 122/74  03/06/24 128/78  02/11/24 120/74   Wt Readings from Last 3 Encounters:  05/18/24 137 lb (62.1 kg)  02/11/24 152 lb (68.9 kg)  12/09/23 166 lb (75.3 kg)    Physical Exam Constitutional:  General: She is not in acute distress.    Appearance: She is not ill-appearing.  HENT:     Right Ear: Tympanic membrane, ear canal and external ear normal.     Left Ear: Tympanic membrane, ear canal and external ear normal.     Nose: Nose normal.     Mouth/Throat:     Mouth: Mucous membranes are moist.     Pharynx: Oropharynx is clear.  Eyes:     Extraocular Movements: Extraocular movements intact.     Conjunctiva/sclera: Conjunctivae normal.     Pupils: Pupils are equal, round, and reactive to light.  Neck:     Thyroid : No thyroid  mass, thyromegaly or thyroid  tenderness.  Cardiovascular:     Rate and Rhythm: Normal rate and regular rhythm.     Pulses: Normal pulses.     Heart sounds: Normal heart sounds.  Pulmonary:     Effort: Pulmonary effort is normal.     Breath sounds: Normal breath sounds.  Abdominal:     General: Bowel sounds are normal.     Palpations: Abdomen is soft.     Tenderness: There is no abdominal tenderness. There is no right CVA tenderness, left CVA tenderness, guarding or rebound.  Musculoskeletal:        General: Normal range of motion.     Cervical back: Normal range of motion and neck supple. No tenderness.     Right lower leg: No edema.     Left lower leg: No edema.  Lymphadenopathy:     Cervical: No cervical adenopathy.  Skin:    General: Skin is warm and dry.     Findings: No lesion or rash.  Neurological:      General: No focal deficit present.     Mental Status: She is alert and oriented to person, place, and time.     Cranial Nerves: No cranial nerve deficit.     Sensory: No sensory deficit.     Motor: No weakness.     Gait: Gait normal.  Psychiatric:        Mood and Affect: Mood normal.        Behavior: Behavior normal.        Thought Content: Thought content normal.      Results for orders placed or performed in visit on 05/18/24  Microalbumin / creatinine urine ratio  Result Value Ref Range   Microalb, Ur <0.7 mg/dL   Creatinine,U 80.2 mg/dL   Microalb Creat Ratio Unable to calculate 0.0 - 30.0 mg/g  Vitamin B12  Result Value Ref Range   Vitamin B-12 703 211 - 911 pg/mL  VITAMIN D  25 Hydroxy (Vit-D Deficiency, Fractures)  Result Value Ref Range   VITD 51.23 30.00 - 100.00 ng/mL  TSH  Result Value Ref Range   TSH 0.87 0.35 - 5.50 uIU/mL  Lipid panel  Result Value Ref Range   Cholesterol 113 0 - 200 mg/dL   Triglycerides 29.9 0.0 - 149.0 mg/dL   HDL 45.69 >60.99 mg/dL   VLDL 85.9 0.0 - 59.9 mg/dL   LDL Cholesterol 45 0 - 99 mg/dL   Total CHOL/HDL Ratio 2    NonHDL 59.03   Hemoglobin A1c  Result Value Ref Range   Hgb A1c MFr Bld 5.8 4.6 - 6.5 %  Comprehensive metabolic panel with GFR  Result Value Ref Range   Sodium 139 135 - 145 mEq/L   Potassium 3.5 3.5 - 5.1 mEq/L   Chloride 101 96 - 112 mEq/L  CO2 28 19 - 32 mEq/L   Glucose, Bld 85 70 - 99 mg/dL   BUN 6 6 - 23 mg/dL   Creatinine, Ser 9.41 0.40 - 1.20 mg/dL   Total Bilirubin 1.1 0.2 - 1.2 mg/dL   Alkaline Phosphatase 49 39 - 117 U/L   AST 19 0 - 37 U/L   ALT 22 0 - 35 U/L   Total Protein 6.9 6.0 - 8.3 g/dL   Albumin 4.4 3.5 - 5.2 g/dL   GFR 03.13 >39.99 mL/min   Calcium  9.0 8.4 - 10.5 mg/dL  CBC with Differential/Platelet  Result Value Ref Range   WBC 5.7 4.0 - 10.5 K/uL   RBC 4.17 3.87 - 5.11 Mil/uL   Hemoglobin 12.6 12.0 - 15.0 g/dL   HCT 62.1 63.9 - 53.9 %   MCV 90.7 78.0 - 100.0 fl   MCHC 33.4 30.0  - 36.0 g/dL   RDW 86.4 88.4 - 84.4 %   Platelets 238.0 150.0 - 400.0 K/uL   Neutrophils Relative % 51.6 43.0 - 77.0 %   Lymphocytes Relative 38.5 12.0 - 46.0 %   Monocytes Relative 7.3 3.0 - 12.0 %   Eosinophils Relative 2.2 0.0 - 5.0 %   Basophils Relative 0.4 0.0 - 3.0 %   Neutro Abs 3.0 1.4 - 7.7 K/uL   Lymphs Abs 2.2 0.7 - 4.0 K/uL   Monocytes Absolute 0.4 0.1 - 1.0 K/uL   Eosinophils Absolute 0.1 0.0 - 0.7 K/uL   Basophils Absolute 0.0 0.0 - 0.1 K/uL      Assessment & Plan:    Routine Health Maintenance and Physical Exam Problem List Items Addressed This Visit     Controlled type 2 diabetes mellitus without complication, without long-term current use of insulin (HCC)   Relevant Orders   CBC with Differential/Platelet (Completed)   Comprehensive metabolic panel with GFR (Completed)   Hemoglobin A1c (Completed)   TSH (Completed)   Microalbumin / creatinine urine ratio (Completed)   EKG 12-Lead   Hyperlipidemia   Relevant Orders   Lipid panel (Completed)   EKG 12-Lead   Insomnia   Other Visit Diagnoses       Encounter for general adult medical examination with abnormal findings    -  Primary     Screen for colon cancer       Relevant Orders   Ambulatory referral to Gastroenterology     Vitamin D  deficiency       Relevant Orders   VITAMIN D  25 Hydroxy (Vit-D Deficiency, Fractures) (Completed)     Vitamin B12 deficiency       Relevant Orders   Vitamin B12 (Completed)     Immunization due       Relevant Orders   Pneumococcal polysaccharide vaccine 23-valent greater than or equal to 2yo subcutaneous/IM (Completed)   Flu vaccine trivalent PF, 6mos and older(Flulaval,Afluria,Fluarix,Fluzone) (Completed)       Assessment and Plan Assessment & Plan Adult Wellness Visit Comprehensive wellness visit conducted with no acute issues. Preventive health care measures and screenings discussed. - Perform EKG - Administer pneumonia vaccine - Administer flu shot - Schedule  follow-up in six months - Scheduled eye exam on November 13th - Follow-up with kidney specialist on November 14th - Pap smear rescheduled for April - Dental appointment scheduled for next month  Type 2 diabetes mellitus without complication Diabetes is well-controlled with last A1c at 6.2% in May. No symptoms of complications. Emphasized maintaining LDL below 70 due to diabetes. - Check A1c -  Check urine microalbumin - Refer to gastroenterology for colonoscopy, Dr Marvis at [facility] as requested.  - Continue current diabetes management plan  Hyperlipidemia Hyperlipidemia is well-managed with LDL previously at 77. Discussed goal of maintaining LDL below 70 to prevent coronary artery disease, heart attack, and strokes. - Check cholesterol levels - Continue atorvastatin  40 mg oral at bedtime  Vitamin b12 Low B12 levels managed with supplementation. - Check vitamin B12 levels  Vitamin d  deficiency - Check vitamin D  level  Insomnia - Continue current treatment      Return in about 6 months (around 11/16/2024) for chronic health conditions.     Boby Mackintosh, NP-C

## 2024-06-03 ENCOUNTER — Other Ambulatory Visit: Payer: Self-pay | Admitting: Family Medicine

## 2024-07-02 ENCOUNTER — Other Ambulatory Visit: Payer: Self-pay | Admitting: Family Medicine

## 2024-07-02 DIAGNOSIS — G4700 Insomnia, unspecified: Secondary | ICD-10-CM

## 2024-07-12 DIAGNOSIS — E538 Deficiency of other specified B group vitamins: Secondary | ICD-10-CM | POA: Diagnosis not present

## 2024-07-12 DIAGNOSIS — E1122 Type 2 diabetes mellitus with diabetic chronic kidney disease: Secondary | ICD-10-CM | POA: Diagnosis not present

## 2024-07-12 DIAGNOSIS — E785 Hyperlipidemia, unspecified: Secondary | ICD-10-CM | POA: Diagnosis not present

## 2024-07-12 DIAGNOSIS — N182 Chronic kidney disease, stage 2 (mild): Secondary | ICD-10-CM | POA: Diagnosis not present

## 2024-07-21 ENCOUNTER — Other Ambulatory Visit: Payer: Self-pay | Admitting: Nephrology

## 2024-07-21 DIAGNOSIS — N182 Chronic kidney disease, stage 2 (mild): Secondary | ICD-10-CM

## 2024-08-02 ENCOUNTER — Encounter: Payer: Self-pay | Admitting: Family Medicine

## 2024-08-03 ENCOUNTER — Other Ambulatory Visit: Payer: Self-pay | Admitting: Family Medicine

## 2024-08-03 MED ORDER — SEMAGLUTIDE (1 MG/DOSE) 4 MG/3ML ~~LOC~~ SOPN: 1.0000 mg | PEN_INJECTOR | SUBCUTANEOUS | 2 refills | Status: DC

## 2024-08-07 ENCOUNTER — Ambulatory Visit
Admission: RE | Admit: 2024-08-07 | Discharge: 2024-08-07 | Disposition: A | Source: Ambulatory Visit | Attending: Nephrology | Admitting: Nephrology

## 2024-08-07 DIAGNOSIS — N182 Chronic kidney disease, stage 2 (mild): Secondary | ICD-10-CM

## 2024-08-12 ENCOUNTER — Emergency Department (HOSPITAL_COMMUNITY)

## 2024-08-12 ENCOUNTER — Emergency Department (HOSPITAL_COMMUNITY)
Admission: EM | Admit: 2024-08-12 | Discharge: 2024-08-12 | Disposition: A | Discharge status: Home / Self Care | Attending: Emergency Medicine | Admitting: Emergency Medicine

## 2024-08-12 ENCOUNTER — Other Ambulatory Visit: Payer: Self-pay

## 2024-08-12 ENCOUNTER — Encounter (HOSPITAL_COMMUNITY): Payer: Self-pay

## 2024-08-12 DIAGNOSIS — Z7984 Long term (current) use of oral hypoglycemic drugs: Secondary | ICD-10-CM | POA: Diagnosis not present

## 2024-08-12 DIAGNOSIS — R531 Weakness: Secondary | ICD-10-CM | POA: Diagnosis not present

## 2024-08-12 DIAGNOSIS — N189 Chronic kidney disease, unspecified: Secondary | ICD-10-CM | POA: Diagnosis not present

## 2024-08-12 DIAGNOSIS — R319 Hematuria, unspecified: Secondary | ICD-10-CM | POA: Insufficient documentation

## 2024-08-12 DIAGNOSIS — R112 Nausea with vomiting, unspecified: Secondary | ICD-10-CM | POA: Diagnosis not present

## 2024-08-12 DIAGNOSIS — E119 Type 2 diabetes mellitus without complications: Secondary | ICD-10-CM | POA: Insufficient documentation

## 2024-08-12 DIAGNOSIS — R109 Unspecified abdominal pain: Secondary | ICD-10-CM | POA: Insufficient documentation

## 2024-08-12 DIAGNOSIS — Z794 Long term (current) use of insulin: Secondary | ICD-10-CM | POA: Diagnosis not present

## 2024-08-12 DIAGNOSIS — R55 Syncope and collapse: Secondary | ICD-10-CM | POA: Insufficient documentation

## 2024-08-12 DIAGNOSIS — R10A3 Flank pain, bilateral: Secondary | ICD-10-CM

## 2024-08-12 LAB — URINALYSIS, ROUTINE W REFLEX MICROSCOPIC
Bacteria, UA: NONE SEEN
Bilirubin Urine: NEGATIVE
Glucose, UA: >=500 mg/dL — AB
Hgb urine dipstick: NEGATIVE
Ketones, ur: 20 mg/dL — AB
Leukocytes,Ua: NEGATIVE
Nitrite: NEGATIVE
Protein, ur: NEGATIVE mg/dL
Specific Gravity, Urine: 1.01 (ref 1.005–1.030)
pH: 6 (ref 5.0–8.0)

## 2024-08-12 LAB — CBC WITH DIFFERENTIAL/PLATELET
Abs Immature Granulocytes: 0.02 K/uL (ref 0.00–0.07)
Basophils Absolute: 0 K/uL (ref 0.0–0.1)
Basophils Relative: 0 %
Eosinophils Absolute: 0.2 K/uL (ref 0.0–0.5)
Eosinophils Relative: 2 %
HCT: 41.4 % (ref 36.0–46.0)
Hemoglobin: 13.6 g/dL (ref 12.0–15.0)
Immature Granulocytes: 0 %
Lymphocytes Relative: 29 %
Lymphs Abs: 2.6 K/uL (ref 0.7–4.0)
MCH: 30.3 pg (ref 26.0–34.0)
MCHC: 32.9 g/dL (ref 30.0–36.0)
MCV: 92.2 fL (ref 80.0–100.0)
Monocytes Absolute: 0.6 K/uL (ref 0.1–1.0)
Monocytes Relative: 6 %
Neutro Abs: 5.7 K/uL (ref 1.7–7.7)
Neutrophils Relative %: 63 %
Platelets: 234 K/uL (ref 150–400)
RBC: 4.49 MIL/uL (ref 3.87–5.11)
RDW: 13.1 % (ref 11.5–15.5)
WBC: 9.1 K/uL (ref 4.0–10.5)
nRBC: 0 % (ref 0.0–0.2)

## 2024-08-12 LAB — I-STAT CHEM 8, ED
BUN: 9 mg/dL (ref 8–23)
BUN: 8 mg/dL (ref 8–23)
Calcium, Ion: 1.02 mmol/L — ABNORMAL LOW (ref 1.15–1.40)
Calcium, Ion: 1.11 mmol/L — ABNORMAL LOW (ref 1.15–1.40)
Chloride: 103 mmol/L (ref 98–111)
Chloride: 102 mmol/L (ref 98–111)
Creatinine, Ser: 0.9 mg/dL (ref 0.44–1.00)
Creatinine, Ser: 0.8 mg/dL (ref 0.44–1.00)
Glucose, Bld: 79 mg/dL (ref 70–99)
Glucose, Bld: 77 mg/dL (ref 70–99)
HCT: 41 % (ref 36.0–46.0)
HCT: 35 % — ABNORMAL LOW (ref 36.0–46.0)
Hemoglobin: 13.9 g/dL (ref 12.0–15.0)
Hemoglobin: 11.9 g/dL — ABNORMAL LOW (ref 12.0–15.0)
Potassium: 3.8 mmol/L (ref 3.5–5.1)
Potassium: 4 mmol/L (ref 3.5–5.1)
Sodium: 139 mmol/L (ref 135–145)
Sodium: 140 mmol/L (ref 135–145)
TCO2: 24 mmol/L (ref 22–32)
TCO2: 24 mmol/L (ref 22–32)

## 2024-08-12 LAB — LIPASE, BLOOD: Lipase: 34 U/L (ref 11–51)

## 2024-08-12 LAB — COMPREHENSIVE METABOLIC PANEL WITH GFR
ALT: 37 U/L (ref 0–44)
AST: 35 U/L (ref 15–41)
Albumin: 4.4 g/dL (ref 3.5–5.0)
Alkaline Phosphatase: 66 U/L (ref 38–126)
Anion gap: 15 (ref 5–15)
BUN: 9 mg/dL (ref 8–23)
CO2: 22 mmol/L (ref 22–32)
Calcium: 9.6 mg/dL (ref 8.9–10.3)
Chloride: 101 mmol/L (ref 98–111)
Creatinine, Ser: 0.76 mg/dL (ref 0.44–1.00)
GFR, Estimated: >60 mL/min
Glucose, Bld: 71 mg/dL (ref 70–99)
Potassium: 4 mmol/L (ref 3.5–5.1)
Sodium: 138 mmol/L (ref 135–145)
Total Bilirubin: 1.2 mg/dL (ref 0.0–1.2)
Total Protein: 7.3 g/dL (ref 6.5–8.1)

## 2024-08-12 LAB — TROPONIN T, HIGH SENSITIVITY
Troponin T High Sensitivity: <15 ng/L (ref 0–19)
Troponin T High Sensitivity: <15 ng/L (ref 0–19)

## 2024-08-12 LAB — I-STAT CG4 LACTIC ACID, ED
Lactic Acid, Venous: 0.9 mmol/L (ref 0.5–1.9)
Lactic Acid, Venous: 1.8 mmol/L (ref 0.5–1.9)

## 2024-08-12 MED ORDER — IOHEXOL 350 MG/ML SOLN
75.0000 mL | Freq: Once | INTRAVENOUS | Status: AC | PRN
  Administered 2024-08-12: 75 mL via INTRAVENOUS

## 2024-08-12 MED ORDER — DIPHENHYDRAMINE HCL 50 MG/ML IJ SOLN
25.0000 mg | Freq: Once | INTRAMUSCULAR | Status: AC
  Administered 2024-08-12: 25 mg via INTRAVENOUS
  Filled 2024-08-12: qty 1

## 2024-08-12 MED ORDER — KETOROLAC TROMETHAMINE 15 MG/ML IJ SOLN
15.0000 mg | Freq: Once | INTRAMUSCULAR | Status: AC
  Administered 2024-08-12: 15 mg via INTRAVENOUS
  Filled 2024-08-12: qty 1

## 2024-08-12 MED ORDER — ACETAMINOPHEN 500 MG PO TABS
1000.0000 mg | ORAL_TABLET | Freq: Once | ORAL | Status: AC
  Administered 2024-08-12: 1000 mg via ORAL
  Filled 2024-08-12: qty 2

## 2024-08-12 MED ORDER — LACTATED RINGERS IV BOLUS
1000.0000 mL | Freq: Once | INTRAVENOUS | Status: AC
  Administered 2024-08-12: 1000 mL via INTRAVENOUS

## 2024-08-12 MED ORDER — METHOCARBAMOL 500 MG PO TABS: 500.0000 mg | ORAL_TABLET | Freq: Two times a day (BID) | ORAL | 0 refills | Status: AC

## 2024-08-12 NOTE — Discharge Instructions (Signed)
 Thank you for letting us  take care of you today.  We saw you for evaluation of flank pain.  We did blood work that was overall reassuring.  It did not look like you had elevated kidney function, elevated white blood cell counts.  Your urine also did not look infected.  We did a CT scan did not show any evidence of significant abnormalities within your kidneys or within your abdomen.  We think this may be related to some muscle strain as a result of your recent renal ultrasound.  We will send you home with Robaxin  to use as needed for symptom management.  Please come back to the emergency department if you have persistent or worsening symptoms.

## 2024-08-12 NOTE — ED Provider Notes (Signed)
 " [facility] Provider Note   CSN: [id number] Arrival date & time: 08/12/24  1430     Patient presents with: Flank Pain   Maria Gross is a 64 y.o. female with a past medical history of diabetes, GERD, CKD who presents today for evaluation of flank pain and hematuria.  Patient reports that she has had flank discomfort for the better for over a week that has become more intense.  Describes bilateral sensations as also was noted gross hematuria.  No significant dysuria.  Is also without any fevers or chills.  No cough or congestion.  Had an episode of nausea and vomiting that was isolated on Wednesday but otherwise ongoing anorexia.  Patient has had ongoing symptoms with associated worsening weakness that ultimately brought her in today for further evaluation.   Flank Pain      Prior to Admission medications  Medication Sig Start Date End Date Taking? Authorizing Provider  atorvastatin  (LIPITOR) 40 MG tablet TAKE 1 TABLET(40 MG) BY MOUTH AT BEDTIME 05/15/24   Henson, Vickie L, NP-C  busPIRone  (BUSPAR ) 5 MG tablet TAKE 1 TABLET(5 MG) BY MOUTH TWICE DAILY 05/15/24   Henson, Vickie L, NP-C  Calcium  Carb-Cholecalciferol (CALCIUM  1000 + D PO)  10/27/20   [provider]  citalopram  (CELEXA ) 40 MG tablet TAKE 1 TABLET(40 MG) BY MOUTH AT BEDTIME 05/15/24   Henson, Vickie L, NP-C  Cyanocobalamin  (VITAMIN B-12 PO) Take 1 tablet by mouth every other day. 3 times a week    [provider]  eletriptan  (RELPAX ) 40 MG tablet Take 1 tablet (40 mg total) by mouth as needed for migraine or headache. May repeat in 2 hours if headache persists or recurs. 01/05/23   Wendolyn Jenkins Jansky, MD  estradiol  (ESTRACE ) 0.1 MG/GM vaginal cream Use 1 g vaginally two times per week as needed to maintain symptom relief. 01/05/23   Cathlyn JAYSON Nikki Bobie FORBES, MD  eszopiclone  (LUNESTA ) 1 MG TABS tablet TAKE 1 TABLET(1 MG) BY MOUTH AT BEDTIME AS NEEDED FOR SLEEP 07/04/24    Henson, Vickie L, NP-C  fluticasone (FLONASE) 50 MCG/ACT nasal spray Place into both nostrils daily.    [provider]  Lansoprazole (PREVACID PO) Take 1 tablet by mouth daily.    [provider]  loratadine (CLARITIN) 10 MG tablet Take 10 mg by mouth daily.    [provider]  metFORMIN  (GLUCOPHAGE -XR) 500 MG 24 hr tablet TAKE 1 TABLET(500 MG) BY MOUTH TWICE DAILY WITH A MEAL 06/05/24   Henson, Vickie L, NP-C  Multiple Vitamin (MULTIVITAMIN) capsule Take 1 capsule by mouth daily.    [provider]  nystatin  powder APPLY TOPICALLY TO THE AFFECTED AREA THREE TIMES DAILY FOR UP TO 7 DAYS 11/05/21   Cathlyn JAYSON Nikki, Bobie FORBES, MD  ondansetron  (ZOFRAN -ODT) 4 MG disintegrating tablet Take 1 tablet (4 mg total) by mouth every 8 (eight) hours as needed. 03/07/24   Norleen Lynwood ORN, MD  Polyethyl Glycol-Propyl Glycol 0.4-0.3 % SOLN Place 1 drop into both eyes 2 (two) times daily.    [provider]  Semaglutide , 1 MG/DOSE, 4 MG/3ML SOPN Inject 1 mg as directed once a week. 08/03/24   Henson, Vickie L, NP-C    Allergies: Ivp dye [iodinated contrast media]    Review of Systems  Genitourinary:  Positive for flank pain.    Updated Vital Signs BP 129/75   Pulse 67   Temp 98.2 F (36.8 C)   Resp  20   Ht 5' 2 (1.575 m)   Wt 57.2 kg   SpO2 98%   BMI 23.05 kg/m   Physical Exam Constitutional:      General: She is not in acute distress.    Appearance: Normal appearance. She is ill-appearing.  HENT:     Head: Normocephalic.     Right Ear: External ear normal.     Left Ear: External ear normal.     Nose: Nose normal.     Mouth/Throat:     Mouth: Mucous membranes are moist.  Eyes:     Pupils: Pupils are equal, round, and reactive to light.  Cardiovascular:     Rate and Rhythm: Normal rate and regular rhythm.     Pulses: Normal pulses.  Pulmonary:     Effort: Pulmonary effort is normal.     Breath sounds: Normal breath sounds.  Abdominal:      General: Abdomen is flat. There is no distension.     Palpations: Abdomen is soft.     Tenderness: There is no abdominal tenderness. There is right CVA tenderness and left CVA tenderness.  Musculoskeletal:        General: Normal range of motion.  Skin:    General: Skin is warm.     Capillary Refill: Capillary refill takes less than 2 seconds.     Findings: No bruising.  Neurological:     General: No focal deficit present.     Mental Status: She is alert and oriented to person, place, and time.  Psychiatric:        Mood and Affect: Mood normal.     (all labs ordered are listed, but only abnormal results are displayed) Labs Reviewed  CULTURE, BLOOD (ROUTINE X 2)  CULTURE, BLOOD (ROUTINE X 2)  CBC WITH DIFFERENTIAL/PLATELET  COMPREHENSIVE METABOLIC PANEL WITH GFR  URINALYSIS, ROUTINE W REFLEX MICROSCOPIC  LIPASE, BLOOD  I-STAT CHEM 8, ED  I-STAT CG4 LACTIC ACID, ED  TROPONIN T, HIGH SENSITIVITY    EKG: None  Radiology: No results found.   Procedures   Medications Ordered in the ED - No data to display                                Medical Decision Making Amount and/or Complexity of Data Reviewed Radiology: ordered.  Risk OTC drugs. Prescription drug management.   Patient is a 64 year old female who presents today for evaluation of worsening flank pain as well as hematuria.  On initial assessment patient was noted to be hemodynamically stable and afebrile.  Patient is ill-appearing on my examination with generalized pallor and weakness.  She did have a syncopal episode during IV stick and blood draw.  Physical examination notable for bilateral CVA tenderness, left greater than right.  Abdomen otherwise benign.  Differential remains broad at this point in time.  With her hematuria as well as CVA tenderness there is consideration for obstructed stone, infected nephrolithiasis, hydronephrosis, pyelonephritis, perinephric abscess.  Patient's extremities are warm  well-perfused and I have lower suspicion at this time given description of symptoms and history for acute aortic pathology.  Will obtain laboratory evaluation and imaging for further assessment.  Patient's laboratory evaluation unremarkable.  CBC and metabolic panel without any acute findings.  Urinalysis noninfectious.  Lactic acid within normal limits, negative troponin.  CT chest abdomen pelvis without any evidence of acute findings.  On reassessment patient remained clinically hemodynamically stable.  Did  have overall improvement of clinical appearance.  No evidence of acute processes contribute to patient's flank pain including perinephric abscess, hydronephrosis, obstructive nephrolithiasis, pyelonephritis.  There is also no evidence of leukocytosis or AKI on laboratory evaluation.  On further discussion patient reports that the symptoms have been persistent since her renal ultrasound.  It is possible that this is related to musculoskeletal strain.  Recommended conservative management in the outpatient setting.  Strict return precautions discussed.  Will send home with Robaxin  to use as needed for symptomatic management.   Final diagnoses:  Bilateral flank pain    ED Discharge Orders          Ordered    methocarbamol  (ROBAXIN ) 500 MG tablet  2 times daily        08/12/24 RENARD Laurita Sieving, MD 08/12/24 2259    Emil Share, DO 08/12/24 2309  "

## 2024-08-12 NOTE — ED Provider Triage Note (Signed)
 Emergency Medicine Provider Triage Evaluation Note  Maria Gross , a 64 y.o. female  was evaluated in triage.  Pt complains of who presents emergency department with a chief complaint of bilateral flank pain worse on the left side, hematuria, chest pain, as well as shortness of breath.  Patient states that she is extremely weak.  Daughter is at bedside and states that patient normally does not complain of pain at all however is not herself and is very weak.  Denies dysuria, urinary frequency, or urgency.  Patient recently had a renal ultrasound performed and states that the pain became more severe after this.  Review of Systems  Positive: Hematuria, flank pain, chest pain, shortness of breath Negative:   Physical Exam  BP 129/75   Pulse 67   Temp 98.2 F (36.8 C)   Resp 20   Ht 5' 2 (1.575 m)   Wt 57.2 kg   SpO2 98%   BMI 23.05 kg/m  Gen:   Awake, weak appearing, uncomfortable Resp:  Normal effort  MSK:   Moves extremities without difficulty  Other:  Abdomen soft and nontender, left CVA tenderness present  Medical Decision Making  Medically screening exam initiated at 3:17 PM.  Appropriate orders placed.  Maria Gross was informed that the remainder of the evaluation will be completed by another provider, this initial triage assessment does not replace that evaluation, and the importance of remaining in the ED until their evaluation is complete.  Orders: CBC, CMP, Chem-8, lipase, lactic acid, UA, blood cultures, troponin, chest x-ray, EKG   Janetta Terrall FALCON, PA-C 08/12/24 1519

## 2024-08-12 NOTE — ED Notes (Signed)
 Pt had syncopal episode while getting her blood drawn . Vitals being retaken now. PA aware

## 2024-08-12 NOTE — ED Triage Notes (Signed)
 C/O bilateral flank pain, blood in urine, nausea/vomiting since Monday. Hx of CKD stage 3. C/O CP and SHOB that started Wednesday.

## 2024-08-17 ENCOUNTER — Encounter: Payer: Self-pay | Admitting: Family Medicine

## 2024-08-17 ENCOUNTER — Ambulatory Visit: Admitting: Family Medicine

## 2024-08-17 VITALS — BP 130/74 | HR 85 | Temp 97.6°F | Ht 62.0 in | Wt 128.0 lb

## 2024-08-17 DIAGNOSIS — Z1211 Encounter for screening for malignant neoplasm of colon: Secondary | ICD-10-CM

## 2024-08-17 DIAGNOSIS — I700 Atherosclerosis of aorta: Secondary | ICD-10-CM

## 2024-08-17 DIAGNOSIS — G4700 Insomnia, unspecified: Secondary | ICD-10-CM

## 2024-08-17 DIAGNOSIS — E119 Type 2 diabetes mellitus without complications: Secondary | ICD-10-CM

## 2024-08-17 DIAGNOSIS — F330 Major depressive disorder, recurrent, mild: Secondary | ICD-10-CM | POA: Diagnosis not present

## 2024-08-17 DIAGNOSIS — K5900 Constipation, unspecified: Secondary | ICD-10-CM

## 2024-08-17 DIAGNOSIS — Z7985 Long-term (current) use of injectable non-insulin antidiabetic drugs: Secondary | ICD-10-CM | POA: Diagnosis not present

## 2024-08-17 DIAGNOSIS — K760 Fatty (change of) liver, not elsewhere classified: Secondary | ICD-10-CM | POA: Diagnosis not present

## 2024-08-17 DIAGNOSIS — R10A2 Flank pain, left side: Secondary | ICD-10-CM

## 2024-08-17 LAB — CULTURE, BLOOD (ROUTINE X 2)
Culture: NO GROWTH
Culture: NO GROWTH
Special Requests: ADEQUATE
Special Requests: ADEQUATE

## 2024-08-17 MED ORDER — OZEMPIC (0.25 OR 0.5 MG/DOSE) 2 MG/3ML ~~LOC~~ SOPN: PEN_INJECTOR | SUBCUTANEOUS | 0 refills | Status: AC

## 2024-08-17 MED ORDER — ESZOPICLONE 2 MG PO TABS: 2.0000 mg | ORAL_TABLET | Freq: Every evening | ORAL | 0 refills | Status: AC | PRN

## 2024-08-17 NOTE — Progress Notes (Signed)
 "  Subjective:     Patient ID: Maria Gross, female    DOB: 1960/08/25, 64 y.o.   MRN: [id number]  Chief Complaint  Patient presents with   Hospitalization Follow-up    [name] 1/17, flank pain. Still having L side flank pain, taking tylenol  every 6 hrs and muscle relaxer at night that was prescribed    HPI  Discussed the use of AI scribe software for clinical note transcription with the patient, who gave verbal consent to proceed.  History of Present Illness Maria Gross is a 64 year old female who presents with flank pain following an emergency department visit.  Flank and back pain - Flank and back pain worsened after emergency department visit - Pain improves with Tylenol  every six hours and a muscle relaxer as needed - Pain increases when not taking medication - CT scan showed no kidney stones - CT scan revealed aortic atherosclerosis, fatty liver, diverticulosis without diverticulitis, and constipation  Syncope - Experienced a syncopal episode during an IV stick at the emergency department  Type 2 diabetes mellitus - Recent A1c 5.8% - Stopped metformin  and started Farxiga six weeks ago - Increased constipation since starting Farxiga - Also on Ozempic  0.5 mg with good tolerance - Weight loss from 169 to 128 pounds; now has normal BMI - Follows a diet emphasizing fruits and vegetables, limits sodium, drinks water and sugar-free beverages  Constipation - Constipation noted on CT scan  Insomnia - Takes Lunesta  1 mg for sleep - Initially improved sleep, but now has difficulty returning to sleep after waking  Mood disturbance - Takes citalopram  and buspirone  for mood - Increased irritability attributed to fatigue and feeling unwell  Vitamin deficiencies and anemia - Vitamin D  and B12 deficiencies, takes supplements - Hemoglobin 11.9, increased vitamin intake to address this  Chronic kidney disease - Stage 2 chronic kidney disease - Recent renal ultrasound  was normal - Follows a kidney-friendly diet - sees nephrologist    Health Maintenance Due  Topic Date Due   Colonoscopy  12/27/2023   OPHTHALMOLOGY EXAM  05/03/2024   Cervical Cancer Screening (HPV/Pap Cotest)  07/11/2024    Past Medical History:  Diagnosis Date   Abnormal Pap smear of cervix    --age 24 had precancerous changes on pap and had hysterectomy   Allergy 1965   pollen, grass, trees   Alopecia    Anemia    Anxiety    Diverticulitis    Endometriosis    Family history of kidney cancer    Family history of ovarian cancer    Family history of prostate cancer    Family history of uterine cancer    High cholesterol    Migraines    some w/aura   Prediabetes    Renal stones    Uterine cancer (HCC)    dx at 30    Past Surgical History:  Procedure Laterality Date   ABDOMINAL HYSTERECTOMY     L-TAH--ovaries remain--uterine   EYE SURGERY  1996   Lasic   left kidney blockage Left    age [age], and age 60   TONSILLECTOMY AND ADENOIDECTOMY     -age 41   urethral stent Left    age 38    Family History  Problem Relation Age of Onset   Diabetes Mother 22   Other Mother        passed away from a blood clot after sinus surgery   Migraines Mother    Early death  Mother    Varicose Veins Mother    COPD Father 55   Diabetes Father    Diabetes Sister    Cancer Maternal Grandfather 14       dec metastatic prostate ca   Leukemia Paternal Aunt    Kidney cancer Paternal Uncle        dx in his late 13s-30s   Glomerulonephritis Paternal Uncle    Cancer Cousin        uterine and ovarian   Cancer Cousin        uterine and ovarian   Hypertension Daughter    Kidney disease Maternal Uncle     Social History   Socioeconomic History   Marital status: Married    Spouse name: Not on file   Number of children: 1   Years of education: Not on file   Highest education level: Bachelor's degree (e.g., BA, AB, BS)  Occupational History   Not on file  Tobacco Use    Smoking status: Never   Smokeless tobacco: Never  Vaping Use   Vaping status: Never Used  Substance and Sexual Activity   Alcohol use: Not Currently    Alcohol/week: 0.0 standard drinks of alcohol   Drug use: No   Sexual activity: Not Currently    Partners: Male    Comment: Hyst  Other Topics Concern   Not on file  Social History Narrative   Daughter, 1 grand son.   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[employer]   Social Drivers of Health   Tobacco Use: Low Risk (08/17/2024)   Patient History    Smoking Tobacco Use: Never    Smokeless Tobacco Use: Never    Passive Exposure: Not on file  Financial Resource Strain: Low Risk (12/09/2023)   Overall Financial Resource Strain (CARDIA)    Difficulty of Paying Living Expenses: Not very hard  Food Insecurity: No Food Insecurity (12/09/2023)   Hunger Vital Sign    Worried About Running Out of Food in the Last Year: Never true    Ran Out of Food in the Last Year: Never true  Transportation Needs: No Transportation Needs (12/09/2023)   PRAPARE - Administrator, Civil Service (Medical): No    Lack of Transportation (Non-Medical): No  Physical Activity: Insufficiently Active (12/09/2023)   Exercise Vital Sign    Days of Exercise per Week: 3 days    Minutes of Exercise per Session: 20 min  Stress: Stress Concern Present (12/09/2023)   [employer] of Occupational Health - Occupational Stress Questionnaire    Feeling of Stress : Rather much  Social Connections: Moderately Integrated (12/09/2023)   Social Connection and Isolation Panel    Frequency of Communication with Friends and Family: More than three times a week    Frequency of Social Gatherings with Friends and Family: Once a week    Attends Religious Services: More than 4 times per year    Active Member of [employer] or Organizations: No    Attends Banker Meetings: Not on file    Marital Status: Married  Catering Manager Violence: Not on file  Depression (PHQ2-9): High Risk  (08/17/2024)   Depression (PHQ2-9)    PHQ-2 Score: 18  Alcohol Screen: Low Risk (12/09/2023)   Alcohol Screen    Last Alcohol Screening Score (AUDIT): 1  Housing: Low Risk (12/09/2023)   Housing Stability Vital Sign    Unable to Pay for Housing in the Last Year: No    Number of Times Moved in  the Last Year: 0    Homeless in the Last Year: No  Utilities: Not on file  Health Literacy: Not on file    Outpatient Medications Prior to Visit  Medication Sig Dispense Refill   atorvastatin  (LIPITOR) 40 MG tablet TAKE 1 TABLET(40 MG) BY MOUTH AT BEDTIME 90 tablet 1   busPIRone  (BUSPAR ) 5 MG tablet TAKE 1 TABLET(5 MG) BY MOUTH TWICE DAILY 180 tablet 1   Calcium  Carb-Cholecalciferol (CALCIUM  1000 + D PO)      citalopram  (CELEXA ) 40 MG tablet TAKE 1 TABLET(40 MG) BY MOUTH AT BEDTIME 90 tablet 1   Cyanocobalamin  (VITAMIN B-12 PO) Take 1 tablet by mouth every other day. 3 times a week     eletriptan  (RELPAX ) 40 MG tablet Take 1 tablet (40 mg total) by mouth as needed for migraine or headache. May repeat in 2 hours if headache persists or recurs. 10 tablet 3   estradiol  (ESTRACE ) 0.1 MG/GM vaginal cream Use 1 g vaginally two times per week as needed to maintain symptom relief. 42.5 g 1   FARXIGA 10 MG TABS tablet Take 10 mg by mouth daily.     fluticasone (FLONASE) 50 MCG/ACT nasal spray Place into both nostrils daily.     Lansoprazole (PREVACID PO) Take 1 tablet by mouth daily.     loratadine (CLARITIN) 10 MG tablet Take 10 mg by mouth daily.     methocarbamol  (ROBAXIN ) 500 MG tablet Take 1 tablet (500 mg total) by mouth 2 (two) times daily. 20 tablet 0   Multiple Vitamin (MULTIVITAMIN) capsule Take 1 capsule by mouth daily.     nystatin  powder APPLY TOPICALLY TO THE AFFECTED AREA THREE TIMES DAILY FOR UP TO 7 DAYS 30 g 3   ondansetron  (ZOFRAN -ODT) 4 MG disintegrating tablet Take 1 tablet (4 mg total) by mouth every 8 (eight) hours as needed. 30 tablet 1   Polyethyl Glycol-Propyl Glycol 0.4-0.3 % SOLN  Place 1 drop into both eyes 2 (two) times daily.     eszopiclone  (LUNESTA ) 1 MG TABS tablet TAKE 1 TABLET(1 MG) BY MOUTH AT BEDTIME AS NEEDED FOR SLEEP 30 tablet 1   Semaglutide , 1 MG/DOSE, 4 MG/3ML SOPN Inject 1 mg as directed once a week. 3 mL 2   metFORMIN  (GLUCOPHAGE -XR) 500 MG 24 hr tablet TAKE 1 TABLET(500 MG) BY MOUTH TWICE DAILY WITH A MEAL 180 tablet 0   No facility-administered medications prior to visit.    Allergies[1]  Review of Systems  Constitutional:  Positive for malaise/fatigue and weight loss. Negative for chills and fever.  Respiratory:  Negative for shortness of breath.   Cardiovascular:  Negative for chest pain, palpitations and leg swelling.  Gastrointestinal:  Negative for abdominal pain, constipation, diarrhea, nausea and vomiting.  Genitourinary:  Positive for flank pain. Negative for dysuria, frequency and urgency.  Neurological:  Negative for dizziness, focal weakness and headaches.  Psychiatric/Behavioral:  Positive for depression. Negative for suicidal ideas. The patient has insomnia.        Objective:    Physical Exam Constitutional:      General: She is not in acute distress.    Appearance: She is not ill-appearing.  HENT:     Mouth/Throat:     Mouth: Mucous membranes are moist.     Pharynx: Oropharynx is clear.  Eyes:     Extraocular Movements: Extraocular movements intact.     Conjunctiva/sclera: Conjunctivae normal.     Pupils: Pupils are equal, round, and reactive to light.  Cardiovascular:  Rate and Rhythm: Normal rate and regular rhythm.  Pulmonary:     Effort: Pulmonary effort is normal.     Breath sounds: Normal breath sounds.  Musculoskeletal:     Cervical back: Normal range of motion and neck supple.     Thoracic back: Normal.     Lumbar back: Normal.     Right lower leg: No edema.     Left lower leg: No edema.  Skin:    General: Skin is warm and dry.     Coloration: Skin is not pale.  Neurological:     General: No focal  deficit present.     Mental Status: She is alert and oriented to person, place, and time.     Cranial Nerves: No cranial nerve deficit.     Sensory: No sensory deficit.     Motor: No weakness.     Coordination: Coordination normal.     Gait: Gait normal.  Psychiatric:        Mood and Affect: Mood normal.        Behavior: Behavior normal.        Thought Content: Thought content normal.      BP 130/74   Pulse 85   Temp 97.6 F (36.4 C) (Temporal)   Ht 5' 2 (1.575 m)   Wt 128 lb (58.1 kg)   SpO2 100%   BMI 23.41 kg/m  Wt Readings from Last 3 Encounters:  08/17/24 128 lb (58.1 kg)  08/12/24 126 lb (57.2 kg)  05/18/24 137 lb (62.1 kg)       Assessment & Plan:   Problem List Items Addressed This Visit     Insomnia   Left flank pain - Primary   Other Visit Diagnoses       Aortic atherosclerosis         Hepatic steatosis         Constipation, unspecified constipation type         Screen for colon cancer       Relevant Orders   Ambulatory referral to Gastroenterology     Mild episode of recurrent major depressive disorder           Assessment and Plan Assessment & Plan Flank pain, partially resolved Recent flank pain episode resolved with Tylenol  and muscle relaxer. No clear etiology identified, possibly related to ultrasound procedure. Pain improved with medication. - Continue Tylenol  as needed for pain management.  Type 2 diabetes mellitus Type 2 diabetes is well-controlled with recent A1c of 5.8%. Transitioned from metformin  to Farxiga. Weight loss noted with semaglutide  use. - Continue Farxiga as prescribed. - Maintain semaglutide  (Ozempic ) at 0.5 mg weekly.  Major depressive disorder Indicated by abnormal PHQ-9 score. Current treatment includes citalopram  and buspirone . Reports irritability and fatigue, possibly related to recent illness. - Continue citalopram  and buspirone  as prescribed. - Encouraged counseling for additional  support.  Insomnia Difficulty maintaining sleep. Currently on eszopiclone  1 mg, which is no longer effective. - Increased eszopiclone  to 2 mg at bedtime. - Encouraged counseling for additional support.  Constipation Noted on CT scan, possibly related to semaglutide  use. Reports infrequent bowel movements. - Start Miralax, one capful daily, adjust as needed.  Chronic kidney disease stage 2 Confirmed by ultrasound. Following kidney-friendly diet. - Continue kidney-friendly diet.  Aortic atherosclerosis Noted on CT scan. - continue Lipitor 40 mg   Hepatic steatosis Noted on CT scan.   Hyperlipidemia Managed with atorvastatin . - Continue atorvastatin  as prescribed.  Vitamin D  deficiency Supplementation ongoing. -  Continue vitamin D  supplementation as prescribed.  Vitamin B12 deficiency Supplementation ongoing. - Continue vitamin B12 supplementation as prescribed.  General Health Maintenance Due for colonoscopy. Previous polyps removed with no issues. - Referred to [facility] for colonoscopy.     I have discontinued Merlin S. Sciascia's metFORMIN , eszopiclone , and Semaglutide  (1 MG/DOSE). I am also having her start on Ozempic  (0.25 or 0.5 MG/DOSE) and eszopiclone . Additionally, I am having her maintain her Cyanocobalamin  (VITAMIN B-12 PO), Polyethyl Glycol-Propyl Glycol, multivitamin, loratadine, Calcium  Carb-Cholecalciferol (CALCIUM  1000 + D PO), nystatin , Lansoprazole (PREVACID PO), fluticasone, estradiol , eletriptan , ondansetron , atorvastatin , citalopram , busPIRone , methocarbamol , and Farxiga.  Meds ordered this encounter  Medications   Semaglutide ,0.25 or 0.5MG /DOS, (OZEMPIC , 0.25 OR 0.5 MG/DOSE,) 2 MG/3ML SOPN    Sig: 0.5 mg [place] weekly    Dispense:  3 mL    Refill:  0    Supervising Provider:   ROLLENE NORRIS A [[id number]]   eszopiclone  (LUNESTA ) 2 MG TABS tablet    Sig: Take 1 tablet (2 mg total) by mouth at bedtime as needed for sleep. Take immediately before bedtime     Dispense:  30 tablet    Refill:  0    Supervising Provider:   ROLLENE NORRIS A [[id number]]       [1]  Allergies Allergen Reactions   Ivp Dye [Iodinated Contrast Media] Other (See Comments)    seizures   "

## 2024-08-17 NOTE — Patient Instructions (Addendum)
 Take Miralax 1-2 capfuls daily to help improve bowel movements

## 2024-08-23 ENCOUNTER — Encounter: Payer: Self-pay | Admitting: *Deleted

## 2024-08-23 NOTE — Progress Notes (Signed)
 Maria Gross                                          MRN: [id number]   08/23/2024   The [facility] Quality Team Specialist reviewed this patient medical record for the purposes of chart review for care gap closure. The following were reviewed: abstraction for care gap closure-kidney health evaluation for diabetes:eGFR  and uACR.    [facility] Quality Team

## 2024-09-01 ENCOUNTER — Other Ambulatory Visit: Payer: Self-pay | Admitting: Family Medicine

## 2024-11-15 ENCOUNTER — Ambulatory Visit: Admitting: Obstetrics and Gynecology

## 2024-11-16 ENCOUNTER — Ambulatory Visit: Admitting: Family Medicine
